# Patient Record
Sex: Male | Born: 2000 | Race: Black or African American | Hispanic: No | Smoking: Never smoker
Health system: Southern US, Community
[De-identification: ages and names within clinical notes are randomized; demographics above are authoritative.]

## PROBLEM LIST (undated history)

## (undated) DIAGNOSIS — R569 Unspecified convulsions: Secondary | ICD-10-CM

## (undated) DIAGNOSIS — F419 Anxiety disorder, unspecified: Secondary | ICD-10-CM

## (undated) DIAGNOSIS — F32A Depression, unspecified: Secondary | ICD-10-CM

## (undated) HISTORY — DX: Depression, unspecified: F32.A

## (undated) HISTORY — DX: Anxiety disorder, unspecified: F41.9

---

## 2020-06-19 ENCOUNTER — Other Ambulatory Visit: Payer: Self-pay

## 2020-06-19 ENCOUNTER — Emergency Department (HOSPITAL_COMMUNITY): Payer: Medicaid Other

## 2020-06-19 ENCOUNTER — Emergency Department (HOSPITAL_COMMUNITY)
Admission: EM | Admit: 2020-06-19 | Discharge: 2020-06-20 | Disposition: A | Discharge status: Home / Self Care | Payer: Medicaid Other | Attending: Emergency Medicine | Admitting: Emergency Medicine

## 2020-06-19 ENCOUNTER — Encounter (HOSPITAL_COMMUNITY): Payer: Self-pay

## 2020-06-19 DIAGNOSIS — R072 Precordial pain: Secondary | ICD-10-CM | POA: Diagnosis present

## 2020-06-19 DIAGNOSIS — U071 COVID-19: Secondary | ICD-10-CM | POA: Insufficient documentation

## 2020-06-19 DIAGNOSIS — R9431 Abnormal electrocardiogram [ECG] [EKG]: Secondary | ICD-10-CM

## 2020-06-19 HISTORY — DX: Unspecified convulsions: R56.9

## 2020-06-19 LAB — CBC
HCT: 48.9 % (ref 39.0–52.0)
Hemoglobin: 16.1 g/dL (ref 13.0–17.0)
MCH: 27.8 pg (ref 26.0–34.0)
MCHC: 32.9 g/dL (ref 30.0–36.0)
MCV: 84.3 fL (ref 80.0–100.0)
Platelets: 255 10*3/uL (ref 150–400)
RBC: 5.8 MIL/uL (ref 4.22–5.81)
RDW: 12.5 % (ref 11.5–15.5)
WBC: 8.7 10*3/uL (ref 4.0–10.5)
nRBC: 0 % (ref 0.0–0.2)

## 2020-06-19 LAB — COMPREHENSIVE METABOLIC PANEL
ALT: 31 U/L (ref 0–44)
AST: 20 U/L (ref 15–41)
Albumin: 4 g/dL (ref 3.5–5.0)
Alkaline Phosphatase: 92 U/L (ref 38–126)
Anion gap: 11 (ref 5–15)
BUN: 6 mg/dL (ref 6–20)
CO2: 22 mmol/L (ref 22–32)
Calcium: 9.3 mg/dL (ref 8.9–10.3)
Chloride: 107 mmol/L (ref 98–111)
Creatinine, Ser: 1.01 mg/dL (ref 0.61–1.24)
GFR, Estimated: >60 mL/min (ref >60)
Glucose, Bld: 101 mg/dL — ABNORMAL HIGH (ref 70–99)
Potassium: 3.9 mmol/L (ref 3.5–5.1)
Sodium: 140 mmol/L (ref 135–145)
Total Bilirubin: 0.5 mg/dL (ref 0.3–1.2)
Total Protein: 7.3 g/dL (ref 6.5–8.1)

## 2020-06-19 LAB — DIFFERENTIAL
Abs Immature Granulocytes: 0.03 10*3/uL (ref 0.00–0.07)
Basophils Absolute: 0 10*3/uL (ref 0.0–0.1)
Basophils Relative: 0 %
Eosinophils Absolute: 0.5 10*3/uL (ref 0.0–0.5)
Eosinophils Relative: 5 %
Immature Granulocytes: 0 %
Lymphocytes Relative: 30 %
Lymphs Abs: 2.6 10*3/uL (ref 0.7–4.0)
Monocytes Absolute: 1 10*3/uL (ref 0.1–1.0)
Monocytes Relative: 12 %
Neutro Abs: 4.6 10*3/uL (ref 1.7–7.7)
Neutrophils Relative %: 53 %

## 2020-06-19 MED ORDER — SODIUM CHLORIDE 0.9% FLUSH
3.0000 mL | Freq: Once | INTRAVENOUS | Status: AC
Start: 2020-06-19 — End: 2020-06-20
  Administered 2020-06-20: 3 mL via INTRAVENOUS

## 2020-06-19 NOTE — ED Triage Notes (Signed)
Pt reports that he has been having CP all day and a headache for the past 4 days, pt reports that he is having L sided weakness that has been going on all day, reports that L sided weakness is normal for the past year but worse today since waking up. Pt has decreased L sided hand grip, no drift, L leg weakness

## 2020-06-20 ENCOUNTER — Emergency Department (HOSPITAL_COMMUNITY): Payer: Medicaid Other

## 2020-06-20 ENCOUNTER — Encounter (HOSPITAL_COMMUNITY): Payer: Self-pay

## 2020-06-20 LAB — TROPONIN I (HIGH SENSITIVITY)
Troponin I (High Sensitivity): 5 ng/L (ref <18)
Troponin I (High Sensitivity): 4 ng/L (ref <18)

## 2020-06-20 LAB — RESP PANEL BY RT-PCR (FLU A&B, COVID) ARPGX2
Influenza A by PCR: NEGATIVE
Influenza B by PCR: NEGATIVE
SARS Coronavirus 2 by RT PCR: POSITIVE — AB

## 2020-06-20 LAB — CBG MONITORING, ED: Glucose-Capillary: 87 mg/dL (ref 70–99)

## 2020-06-20 MED ORDER — IOHEXOL 350 MG/ML SOLN
100.0000 mL | Freq: Once | INTRAVENOUS | Status: AC | PRN
  Administered 2020-06-20: 100 mL via INTRAVENOUS

## 2020-06-20 MED ORDER — ASPIRIN 81 MG PO CHEW
324.0000 mg | CHEWABLE_TABLET | Freq: Once | ORAL | Status: AC
  Administered 2020-06-20: 324 mg via ORAL
  Filled 2020-06-20: qty 4

## 2020-06-20 MED ORDER — KETOROLAC TROMETHAMINE 30 MG/ML IJ SOLN
30.0000 mg | Freq: Once | INTRAMUSCULAR | Status: AC
  Administered 2020-06-20: 30 mg via INTRAVENOUS
  Filled 2020-06-20: qty 1

## 2020-06-20 MED ORDER — ALUM & MAG HYDROXIDE-SIMETH 200-200-20 MG/5ML PO SUSP
30.0000 mL | Freq: Once | ORAL | Status: AC
  Administered 2020-06-20: 30 mL via ORAL
  Filled 2020-06-20: qty 30

## 2020-06-20 NOTE — ED Provider Notes (Signed)
[name] EMERGENCY DEPARTMENT Provider Note   CSN: [id number] Arrival date & time: 06/19/20  2225     History Chief Complaint  Patient presents with  . Chest Pain    Craig Carlson is a 20 y.o. male.  The history is provided by the patient.  Chest Pain Pain location:  Substernal area Pain quality: throbbing   Pain radiates to:  Does not radiate Pain severity:  Moderate Onset quality:  Gradual Timing:  Constant Progression:  Unchanged Chronicity:  New Context: at rest   Relieved by:  Nothing Worsened by:  Nothing Ineffective treatments:  None tried Associated symptoms: cough and nausea   Associated symptoms: no dizziness, no fever and no shortness of breath   Risk factors: male sex   Risk factors: no aortic disease   Also has chtronic migraines worse in the last week.  He also has chronic hand weakness for which he is followed at [facility]. No leg pain or swelling.      Past Medical History:  Diagnosis Date  . Seizures (HCC)     There are no problems to display for this patient.   History reviewed. No pertinent surgical history.     History reviewed. No pertinent family history.  Social History   Tobacco Use  . Smoking status: Never Smoker  . Smokeless tobacco: Never Used  Substance Use Topics  . Alcohol use: Not Currently  . Drug use: Not Currently    Home Medications Prior to Admission medications   Not on File    Allergies    Patient has no known allergies.  Review of Systems   Review of Systems  Constitutional: Negative for fever.  HENT: Negative for congestion.   Eyes: Negative for visual disturbance.  Respiratory: Positive for cough. Negative for shortness of breath.   Cardiovascular: Positive for chest pain.  Gastrointestinal: Positive for nausea.  Genitourinary: Negative for difficulty urinating.  Musculoskeletal: Negative for arthralgias.  Skin: Negative for rash.  Neurological: Negative for dizziness.   Psychiatric/Behavioral: Negative for agitation.  All other systems reviewed and are negative.   Physical Exam Updated Vital Signs BP (!) 107/50   Pulse 64   Temp 98.7 F (37.1 C) (Oral)   Resp 15   SpO2 98%   Physical Exam Vitals and nursing note reviewed.  Constitutional:      Appearance: Normal appearance. He is not ill-appearing.  HENT:     Head: Normocephalic and atraumatic.     Nose: Nose normal.  Eyes:     Conjunctiva/sclera: Conjunctivae normal.     Pupils: Pupils are equal, round, and reactive to light.  Cardiovascular:     Rate and Rhythm: Normal rate and regular rhythm.     Pulses: Normal pulses.     Heart sounds: Normal heart sounds.  Pulmonary:     Effort: Pulmonary effort is normal.     Breath sounds: Normal breath sounds.  Abdominal:     General: Abdomen is flat. Bowel sounds are normal.     Palpations: Abdomen is soft.     Tenderness: There is no abdominal tenderness. There is no guarding.  Musculoskeletal:        General: Normal range of motion.     Cervical back: Normal range of motion and neck supple.  Skin:    General: Skin is warm and dry.     Capillary Refill: Capillary refill takes less than 2 seconds.  Neurological:     General: No focal deficit present.  Mental Status: He is alert and oriented to person, place, and time.     Deep Tendon Reflexes: Reflexes normal.  Psychiatric:        Mood and Affect: Mood normal.        Behavior: Behavior normal.     ED Results / Procedures / Treatments   Labs (all labs ordered are listed, but only abnormal results are displayed) Results for orders placed or performed during the hospital encounter of 06/19/20  Resp Panel by RT-PCR (Flu A&B, Covid) Nasopharyngeal Swab   Specimen: Nasopharyngeal Swab; Nasopharyngeal(NP) swabs in vial transport medium  Result Value Ref Range   SARS Coronavirus 2 by RT PCR POSITIVE (A) NEGATIVE   Influenza A by PCR NEGATIVE NEGATIVE   Influenza B by PCR NEGATIVE  NEGATIVE  CBC  Result Value Ref Range   WBC 8.7 4.0 - 10.5 K/uL   RBC 5.80 4.22 - 5.81 MIL/uL   Hemoglobin 16.1 13.0 - 17.0 g/dL   HCT 42.8 76.8 - 11.5 %   MCV 84.3 80.0 - 100.0 fL   MCH 27.8 26.0 - 34.0 pg   MCHC 32.9 30.0 - 36.0 g/dL   RDW 72.6 20.3 - 55.9 %   Platelets 255 150 - 400 K/uL   nRBC 0.0 0.0 - 0.2 %  Differential  Result Value Ref Range   Neutrophils Relative % 53 %   Neutro Abs 4.6 1.7 - 7.7 K/uL   Lymphocytes Relative 30 %   Lymphs Abs 2.6 0.7 - 4.0 K/uL   Monocytes Relative 12 %   Monocytes Absolute 1.0 0.1 - 1.0 K/uL   Eosinophils Relative 5 %   Eosinophils Absolute 0.5 0.0 - 0.5 K/uL   Basophils Relative 0 %   Basophils Absolute 0.0 0.0 - 0.1 K/uL   Immature Granulocytes 0 %   Abs Immature Granulocytes 0.03 0.00 - 0.07 K/uL  Comprehensive metabolic panel  Result Value Ref Range   Sodium 140 135 - 145 mmol/L   Potassium 3.9 3.5 - 5.1 mmol/L   Chloride 107 98 - 111 mmol/L   CO2 22 22 - 32 mmol/L   Glucose, Bld 101 (H) 70 - 99 mg/dL   BUN 6 6 - 20 mg/dL   Creatinine, Ser 7.41 0.61 - 1.24 mg/dL   Calcium 9.3 8.9 - 63.8 mg/dL   Total Protein 7.3 6.5 - 8.1 g/dL   Albumin 4.0 3.5 - 5.0 g/dL   AST 20 15 - 41 U/L   ALT 31 0 - 44 U/L   Alkaline Phosphatase 92 38 - 126 U/L   Total Bilirubin 0.5 0.3 - 1.2 mg/dL   GFR, Estimated >45 >36 mL/min   Anion gap 11 5 - 15  CBG monitoring, ED  Result Value Ref Range   Glucose-Capillary 87 70 - 99 mg/dL  Troponin I (High Sensitivity)  Result Value Ref Range   Troponin I (High Sensitivity) 5 <18 ng/L  Troponin I (High Sensitivity)  Result Value Ref Range   Troponin I (High Sensitivity) 4 <18 ng/L   CT HEAD WO CONTRAST  Result Date: 06/19/2020 CLINICAL DATA:  Four days of headache with left-sided weakness for the past year, worsening today EXAM: CT HEAD WITHOUT CONTRAST TECHNIQUE: Contiguous axial images were obtained from the base of the skull through the vertex without intravenous contrast. COMPARISON:  CT  04/11/2020 FINDINGS: Brain: No evidence of acute infarction, hemorrhage, hydrocephalus, extra-axial collection, visible mass lesion or mass effect. Midline intracranial structures are unremarkable. Cerebellar tonsils are normally positioned. Scattered  benign dural calcifications. Vascular: No hyperdense vessel or unexpected calcification. Skull: No calvarial fracture or suspicious osseous lesion. No scalp swelling or hematoma. Sinuses/Orbits: Mild mural thickening in the ethmoids. Remaining paranasal sinuses and mastoid air cells are predominantly clear with pneumatization of the petrous apices and squamosal portions of the temporal bones. Other: None. IMPRESSION: No acute intracranial abnormality. Electronically Signed   By: Kreg ShropshirePrice  DeHay M.D.   On: 06/19/2020 23:19   CT Angio Chest/Abd/Pel for Dissection W and/or Wo Contrast  Result Date: 06/20/2020 CLINICAL DATA:  Abdominal pain. Aortic dissection suspected. Left-sided weakness. EXAM: CT ANGIOGRAPHY CHEST, ABDOMEN AND PELVIS TECHNIQUE: Non-contrast CT of the chest was initially obtained. Multidetector CT imaging through the chest, abdomen and pelvis was performed using the standard protocol during bolus administration of intravenous contrast. Multiplanar reconstructed images and MIPs were obtained and reviewed to evaluate the vascular anatomy. CONTRAST:  100mL OMNIPAQUE IOHEXOL 350 MG/ML SOLN COMPARISON:  None. FINDINGS: CTA CHEST FINDINGS Cardiovascular: There is no evidence for a thoracic aortic dissection or aneurysm. There is an aberrant right subclavian artery, a normal variant. There is no large centrally located pulmonary embolism. The heart size is normal. There is no significant pericardial effusion. Mediastinum/Nodes: -- No mediastinal lymphadenopathy. -- No hilar lymphadenopathy. -- No axillary lymphadenopathy. -- No supraclavicular lymphadenopathy. -- Normal thyroid gland where visualized. -  Unremarkable esophagus. Lungs/Pleura: Airways are  patent. No pleural effusion, lobar consolidation, pneumothorax or pulmonary infarction. Musculoskeletal: No chest wall abnormality. No bony spinal canal stenosis. Review of the MIP images confirms the above findings. CTA ABDOMEN AND PELVIS FINDINGS VASCULAR Aorta: Normal caliber aorta without aneurysm, dissection, vasculitis or significant stenosis. Celiac: Patent without evidence of aneurysm, dissection, vasculitis or significant stenosis. SMA: Patent without evidence of aneurysm, dissection, vasculitis or significant stenosis. Renals: Both renal arteries are patent without evidence of aneurysm, dissection, vasculitis, fibromuscular dysplasia or significant stenosis. IMA: Patent without evidence of aneurysm, dissection, vasculitis or significant stenosis. Inflow: Patent without evidence of aneurysm, dissection, vasculitis or significant stenosis. Veins: No obvious venous abnormality within the limitations of this arterial phase study. Review of the MIP images confirms the above findings. NON-VASCULAR Hepatobiliary: The liver is normal. Normal gallbladder.There is no biliary ductal dilation. Pancreas: Normal contours without ductal dilatation. No peripancreatic fluid collection. Spleen: Unremarkable. Adrenals/Urinary Tract: --Adrenal glands: Unremarkable. --Right kidney/ureter: No hydronephrosis or radiopaque kidney stones. --Left kidney/ureter: No hydronephrosis or radiopaque kidney stones. --Urinary bladder: Unremarkable. Stomach/Bowel: --Stomach/Duodenum: No hiatal hernia or other gastric abnormality. Normal duodenal course and caliber. --Small bowel: Unremarkable. --Colon: Unremarkable. --Appendix: Normal. Vascular/Lymphatic: Normal course and caliber of the major abdominal vessels. --No retroperitoneal lymphadenopathy. --No mesenteric lymphadenopathy. --No pelvic or inguinal lymphadenopathy. Reproductive: Unremarkable Other: No ascites or free air. The abdominal wall is normal. Musculoskeletal. No acute  displaced fractures. Review of the MIP images confirms the above findings. IMPRESSION: 1. No acute thoracic, abdominal or pelvic pathology. Specifically, there is no evidence for a thoracic aortic dissection or aneurysm. 2. Incidentally noted aberrant right subclavian artery, a normal variant. Electronically Signed   By: Katherine Mantlehristopher  Green M.D.   On: 06/20/2020 01:32    EKG See MUSE Radiology CT HEAD WO CONTRAST  Result Date: 06/19/2020 CLINICAL DATA:  Four days of headache with left-sided weakness for the past year, worsening today EXAM: CT HEAD WITHOUT CONTRAST TECHNIQUE: Contiguous axial images were obtained from the base of the skull through the vertex without intravenous contrast. COMPARISON:  CT 04/11/2020 FINDINGS: Brain: No evidence of acute infarction, hemorrhage, hydrocephalus, extra-axial collection, visible  mass lesion or mass effect. Midline intracranial structures are unremarkable. Cerebellar tonsils are normally positioned. Scattered benign dural calcifications. Vascular: No hyperdense vessel or unexpected calcification. Skull: No calvarial fracture or suspicious osseous lesion. No scalp swelling or hematoma. Sinuses/Orbits: Mild mural thickening in the ethmoids. Remaining paranasal sinuses and mastoid air cells are predominantly clear with pneumatization of the petrous apices and squamosal portions of the temporal bones. Other: None. IMPRESSION: No acute intracranial abnormality. Electronically Signed   By: Kreg ShropshirePrice  DeHay M.D.   On: 06/19/2020 23:19   CT Angio Chest/Abd/Pel for Dissection W and/or Wo Contrast  Result Date: 06/20/2020 CLINICAL DATA:  Abdominal pain. Aortic dissection suspected. Left-sided weakness. EXAM: CT ANGIOGRAPHY CHEST, ABDOMEN AND PELVIS TECHNIQUE: Non-contrast CT of the chest was initially obtained. Multidetector CT imaging through the chest, abdomen and pelvis was performed using the standard protocol during bolus administration of intravenous contrast. Multiplanar  reconstructed images and MIPs were obtained and reviewed to evaluate the vascular anatomy. CONTRAST:  100mL OMNIPAQUE IOHEXOL 350 MG/ML SOLN COMPARISON:  None. FINDINGS: CTA CHEST FINDINGS Cardiovascular: There is no evidence for a thoracic aortic dissection or aneurysm. There is an aberrant right subclavian artery, a normal variant. There is no large centrally located pulmonary embolism. The heart size is normal. There is no significant pericardial effusion. Mediastinum/Nodes: -- No mediastinal lymphadenopathy. -- No hilar lymphadenopathy. -- No axillary lymphadenopathy. -- No supraclavicular lymphadenopathy. -- Normal thyroid gland where visualized. -  Unremarkable esophagus. Lungs/Pleura: Airways are patent. No pleural effusion, lobar consolidation, pneumothorax or pulmonary infarction. Musculoskeletal: No chest wall abnormality. No bony spinal canal stenosis. Review of the MIP images confirms the above findings. CTA ABDOMEN AND PELVIS FINDINGS VASCULAR Aorta: Normal caliber aorta without aneurysm, dissection, vasculitis or significant stenosis. Celiac: Patent without evidence of aneurysm, dissection, vasculitis or significant stenosis. SMA: Patent without evidence of aneurysm, dissection, vasculitis or significant stenosis. Renals: Both renal arteries are patent without evidence of aneurysm, dissection, vasculitis, fibromuscular dysplasia or significant stenosis. IMA: Patent without evidence of aneurysm, dissection, vasculitis or significant stenosis. Inflow: Patent without evidence of aneurysm, dissection, vasculitis or significant stenosis. Veins: No obvious venous abnormality within the limitations of this arterial phase study. Review of the MIP images confirms the above findings. NON-VASCULAR Hepatobiliary: The liver is normal. Normal gallbladder.There is no biliary ductal dilation. Pancreas: Normal contours without ductal dilatation. No peripancreatic fluid collection. Spleen: Unremarkable. Adrenals/Urinary  Tract: --Adrenal glands: Unremarkable. --Right kidney/ureter: No hydronephrosis or radiopaque kidney stones. --Left kidney/ureter: No hydronephrosis or radiopaque kidney stones. --Urinary bladder: Unremarkable. Stomach/Bowel: --Stomach/Duodenum: No hiatal hernia or other gastric abnormality. Normal duodenal course and caliber. --Small bowel: Unremarkable. --Colon: Unremarkable. --Appendix: Normal. Vascular/Lymphatic: Normal course and caliber of the major abdominal vessels. --No retroperitoneal lymphadenopathy. --No mesenteric lymphadenopathy. --No pelvic or inguinal lymphadenopathy. Reproductive: Unremarkable Other: No ascites or free air. The abdominal wall is normal. Musculoskeletal. No acute displaced fractures. Review of the MIP images confirms the above findings. IMPRESSION: 1. No acute thoracic, abdominal or pelvic pathology. Specifically, there is no evidence for a thoracic aortic dissection or aneurysm. 2. Incidentally noted aberrant right subclavian artery, a normal variant. Electronically Signed   By: Katherine Mantlehristopher  Green M.D.   On: 06/20/2020 01:32    Procedures Procedures (including critical care time)  Medications Ordered in ED Medications  sodium chloride flush (NS) 0.9 % injection 3 mL (3 mLs Intravenous Given 06/20/20 0220)  iohexol (OMNIPAQUE) 350 MG/ML injection 100 mL (100 mLs Intravenous Contrast Given 06/20/20 0117)  aspirin chewable tablet 324 mg (324 mg  Oral Given 06/20/20 0153)  ketorolac (TORADOL) 30 MG/ML injection 30 mg (30 mg Intravenous Given 06/20/20 0219)  alum & mag hydroxide-simeth (MAALOX/MYLANTA) 200-200-20 MG/5ML suspension 30 mL (30 mLs Oral Given 06/20/20 0219)    ED Course  I have reviewed the triage vital signs and the nursing notes.  Pertinent labs & imaging results that were available during my care of the patient were reviewed by me and considered in my medical decision making (see chart for details).   3am Case d/w Dr. Mackie Pai of cardiology,  Not a stemi, cycle  troponins and follow up as an outpatient.   Patient ruled out for MI and dissection,  Symptoms likely due to covid.  Well appearing stable for discharge with close cardiology followup.   Craig Carlson Code was evaluated in Emergency Department on 06/20/2020 for the symptoms described in the history of present illness. He was evaluated in the context of the global COVID-19 pandemic, which necessitated consideration that the patient might be at risk for infection with the SARS-CoV-2 virus that causes COVID-19. Institutional protocols and algorithms that pertain to the evaluation of patients at risk for COVID-19 are in a state of rapid change based on information released by regulatory bodies including the CDC and federal and state organizations. These policies and algorithms were followed during the patient's care in the ED.    Final Clinical Impression(s) / ED Diagnoses  Return for intractable cough, coughing up blood,fevers >100.4 unrelieved by medication, shortness of breath, intractable vomiting, chest pain, shortness of breath, weakness,numbness, changes in speech, facial asymmetry,abdominal pain, passing out,Inability to tolerate liquids or food, cough, altered mental status or any concerns. No signs of systemic illness or infection. The patient is nontoxic-appearing on exam and vital signs are within normal limits.   I have reviewed the triage vital signs and the nursing notes. Pertinent labs &imaging results that were available during my care of the patient were reviewed by me and considered in my medical decision making (see chart for details).After history, exam, and medical workup I feel the patient has beenappropriately medically screened and is safe for discharge home. Pertinent diagnoses were discussed with the patient. Patient was given return precautions.   Kylii Ennis, MD 06/20/20 [phone]

## 2020-06-20 NOTE — ED Notes (Signed)
Pt transported to CT at this time.

## 2020-06-20 NOTE — ED Notes (Signed)
Date and time results received: 06/20/20 0255 (use smartphrase ".now" to insert current time)  Test: Covid Critical Value: Positive  Name of Provider Notified: Palumbo  Orders Received? Or Actions Taken?: No new orders received.

## 2020-10-27 ENCOUNTER — Telehealth: Payer: Self-pay

## 2020-10-27 NOTE — Telephone Encounter (Signed)
Patient has appt with Zelda on 6/1 but provider is working virtual that day. Called Patient and left vm that appt is virtual and he does not need to come to the office. If in person is preferred to call [phone] to reschedule in person.

## 2020-11-01 ENCOUNTER — Ambulatory Visit: Payer: Medicaid Other | Attending: Nurse Practitioner | Admitting: Nurse Practitioner

## 2020-11-01 ENCOUNTER — Telehealth: Payer: Self-pay | Admitting: Nurse Practitioner

## 2020-11-01 ENCOUNTER — Other Ambulatory Visit: Payer: Self-pay

## 2020-11-01 NOTE — Telephone Encounter (Signed)
No answer. LVM to return call to office for televisit today.  

## 2020-11-24 ENCOUNTER — Emergency Department (HOSPITAL_COMMUNITY)
Admission: EM | Admit: 2020-11-24 | Discharge: 2020-11-25 | Disposition: A | Discharge status: Home / Self Care | Payer: Medicaid Other | Attending: Emergency Medicine | Admitting: Emergency Medicine

## 2020-11-24 DIAGNOSIS — H53149 Visual discomfort, unspecified: Secondary | ICD-10-CM | POA: Diagnosis not present

## 2020-11-24 DIAGNOSIS — R569 Unspecified convulsions: Secondary | ICD-10-CM | POA: Diagnosis not present

## 2020-11-24 DIAGNOSIS — R519 Headache, unspecified: Secondary | ICD-10-CM

## 2020-11-24 LAB — BASIC METABOLIC PANEL
Anion gap: 7 (ref 5–15)
BUN: 9 mg/dL (ref 6–20)
CO2: 26 mmol/L (ref 22–32)
Calcium: 9.2 mg/dL (ref 8.9–10.3)
Chloride: 106 mmol/L (ref 98–111)
Creatinine, Ser: 1.11 mg/dL (ref 0.61–1.24)
GFR, Estimated: >60 mL/min (ref >60)
Glucose, Bld: 93 mg/dL (ref 70–99)
Potassium: 3.9 mmol/L (ref 3.5–5.1)
Sodium: 139 mmol/L (ref 135–145)

## 2020-11-24 LAB — CBC WITH DIFFERENTIAL/PLATELET
Abs Immature Granulocytes: 0.02 10*3/uL (ref 0.00–0.07)
Basophils Absolute: 0 10*3/uL (ref 0.0–0.1)
Basophils Relative: 0 %
Eosinophils Absolute: 0.4 10*3/uL (ref 0.0–0.5)
Eosinophils Relative: 4 %
HCT: 47.1 % (ref 39.0–52.0)
Hemoglobin: 15.4 g/dL (ref 13.0–17.0)
Immature Granulocytes: 0 %
Lymphocytes Relative: 38 %
Lymphs Abs: 3.5 10*3/uL (ref 0.7–4.0)
MCH: 27.8 pg (ref 26.0–34.0)
MCHC: 32.7 g/dL (ref 30.0–36.0)
MCV: 85 fL (ref 80.0–100.0)
Monocytes Absolute: 0.8 10*3/uL (ref 0.1–1.0)
Monocytes Relative: 9 %
Neutro Abs: 4.6 10*3/uL (ref 1.7–7.7)
Neutrophils Relative %: 49 %
Platelets: 274 10*3/uL (ref 150–400)
RBC: 5.54 MIL/uL (ref 4.22–5.81)
RDW: 12.4 % (ref 11.5–15.5)
WBC: 9.2 10*3/uL (ref 4.0–10.5)
nRBC: 0 % (ref 0.0–0.2)

## 2020-11-24 LAB — CBG MONITORING, ED: Glucose-Capillary: 92 mg/dL (ref 70–99)

## 2020-11-24 MED ORDER — METOCLOPRAMIDE HCL 5 MG/ML IJ SOLN
10.0000 mg | Freq: Once | INTRAMUSCULAR | Status: AC
  Administered 2020-11-24: 10 mg via INTRAVENOUS
  Filled 2020-11-24: qty 2

## 2020-11-24 MED ORDER — DIPHENHYDRAMINE HCL 50 MG/ML IJ SOLN
50.0000 mg | Freq: Once | INTRAMUSCULAR | Status: AC
  Administered 2020-11-24: 50 mg via INTRAVENOUS
  Filled 2020-11-24: qty 1
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
## 2020-11-25 MED ORDER — ACETAMINOPHEN 500 MG PO TABS
1000.0000 mg | ORAL_TABLET | Freq: Once | ORAL | Status: AC
  Administered 2020-11-25: 1000 mg via ORAL
  Filled 2020-11-25: qty 2

## 2020-11-25 NOTE — Discharge Instructions (Signed)
You came to the emergency department today to be evaluated for your seizure-like activity.  Your physical exam, lab work and EKG were reassuring.  Please follow-up with your neurologist at [facility].  If you are unable to follow-up with his neurologist please use information on this page to contact a local neurologist.  Do not drive or operate heavy machinery until you can be reevaluated by neurologist in the outpatient setting  Get help right away if: You injure yourself during a non-epileptic seizure. You have one non-epileptic seizure after another. You have trouble recovering from a non-epileptic seizure. You have chest pain or trouble breathing. You have a non-epileptic seizure that lasts longer than 5 minutes.

## 2020-11-25 NOTE — ED Notes (Signed)
Pt given sandwich bag, cheese, and crackers

## 2020-11-25 NOTE — ED Notes (Signed)
E-signature pad unavailable at time of pt discharge. This RN discussed discharge materials with pt and answered all pt questions. Pt stated understanding of discharge material. ? ?

## 2020-12-08 ENCOUNTER — Emergency Department (HOSPITAL_BASED_OUTPATIENT_CLINIC_OR_DEPARTMENT_OTHER): Payer: Medicaid Other | Admitting: Radiology

## 2020-12-08 ENCOUNTER — Emergency Department (HOSPITAL_BASED_OUTPATIENT_CLINIC_OR_DEPARTMENT_OTHER)
Admission: EM | Admit: 2020-12-08 | Discharge: 2020-12-08 | Disposition: A | Discharge status: Home / Self Care | Payer: Medicaid Other | Attending: Emergency Medicine | Admitting: Emergency Medicine

## 2020-12-08 ENCOUNTER — Encounter (HOSPITAL_BASED_OUTPATIENT_CLINIC_OR_DEPARTMENT_OTHER): Payer: Self-pay

## 2020-12-08 ENCOUNTER — Other Ambulatory Visit: Payer: Self-pay

## 2020-12-08 DIAGNOSIS — R1030 Lower abdominal pain, unspecified: Secondary | ICD-10-CM | POA: Insufficient documentation

## 2020-12-08 DIAGNOSIS — R0789 Other chest pain: Secondary | ICD-10-CM | POA: Diagnosis not present

## 2020-12-08 DIAGNOSIS — R300 Dysuria: Secondary | ICD-10-CM | POA: Diagnosis not present

## 2020-12-08 LAB — URINALYSIS, ROUTINE W REFLEX MICROSCOPIC
Bilirubin Urine: NEGATIVE
Glucose, UA: NEGATIVE mg/dL
Hgb urine dipstick: NEGATIVE
Ketones, ur: NEGATIVE mg/dL
Leukocytes,Ua: NEGATIVE
Nitrite: NEGATIVE
Specific Gravity, Urine: 1.025 (ref 1.005–1.030)
pH: 6.5 (ref 5.0–8.0)

## 2020-12-08 LAB — BASIC METABOLIC PANEL
Anion gap: 7 (ref 5–15)
BUN: 9 mg/dL (ref 6–20)
CO2: 27 mmol/L (ref 22–32)
Calcium: 8.9 mg/dL (ref 8.9–10.3)
Chloride: 105 mmol/L (ref 98–111)
Creatinine, Ser: 0.98 mg/dL (ref 0.61–1.24)
GFR, Estimated: >60 mL/min (ref >60)
Glucose, Bld: 96 mg/dL (ref 70–99)
Potassium: 3.8 mmol/L (ref 3.5–5.1)
Sodium: 139 mmol/L (ref 135–145)

## 2020-12-08 LAB — TROPONIN I (HIGH SENSITIVITY): Troponin I (High Sensitivity): 2 ng/L (ref <18)

## 2020-12-08 LAB — CBC WITH DIFFERENTIAL/PLATELET
Abs Immature Granulocytes: 0.01 10*3/uL (ref 0.00–0.07)
Basophils Absolute: 0 10*3/uL (ref 0.0–0.1)
Basophils Relative: 0 %
Eosinophils Absolute: 0.3 10*3/uL (ref 0.0–0.5)
Eosinophils Relative: 4 %
HCT: 47 % (ref 39.0–52.0)
Hemoglobin: 15.7 g/dL (ref 13.0–17.0)
Immature Granulocytes: 0 %
Lymphocytes Relative: 47 %
Lymphs Abs: 3.3 10*3/uL (ref 0.7–4.0)
MCH: 28 pg (ref 26.0–34.0)
MCHC: 33.4 g/dL (ref 30.0–36.0)
MCV: 83.9 fL (ref 80.0–100.0)
Monocytes Absolute: 0.8 10*3/uL (ref 0.1–1.0)
Monocytes Relative: 11 %
Neutro Abs: 2.7 10*3/uL (ref 1.7–7.7)
Neutrophils Relative %: 38 %
Platelets: 251 10*3/uL (ref 150–400)
RBC: 5.6 MIL/uL (ref 4.22–5.81)
RDW: 12.8 % (ref 11.5–15.5)
WBC: 7.1 10*3/uL (ref 4.0–10.5)
nRBC: 0 % (ref 0.0–0.2)

## 2020-12-08 MED ORDER — CEFTRIAXONE SODIUM 500 MG IJ SOLR
500.0000 mg | Freq: Once | INTRAMUSCULAR | Status: AC
  Administered 2020-12-08: 500 mg via INTRAMUSCULAR
  Filled 2020-12-08: qty 500

## 2020-12-08 MED ORDER — DOXYCYCLINE HYCLATE 100 MG PO TABS: 100.0000 mg | ORAL_TABLET | Freq: Two times a day (BID) | ORAL | 0 refills | Status: DC

## 2020-12-08 MED ORDER — LIDOCAINE HCL (PF) 1 % IJ SOLN
1.0000 mL | Freq: Once | INTRAMUSCULAR | Status: AC
  Administered 2020-12-08: 1 mL
  Filled 2020-12-08: qty 5

## 2020-12-08 NOTE — ED Triage Notes (Signed)
Patient here POV from Home with CP and ABD Pain.  Patient states he has been having Lower ABD Pain for 2 weeks that has been worsening. Patient also states he has been having Left Sided CP for approximately 1 month that is sharp, intermittent, and radiates to R. Arm.  Hx of Seizures (states he has been having them more frequently). Patient also states it is painful at times to urinate.   Ambulatory, GCS 15. NAD. VSS. No SOB, No N/V/D.

## 2020-12-08 NOTE — ED Notes (Signed)
Pt verbalizes understanding discharge instructions, reviewed with pt.

## 2020-12-08 NOTE — ED Provider Notes (Signed)
[facility] EMERGENCY DEPARTMENT Provider Note  CSN: [id number] Arrival date & time: 12/08/20 0654    History Chief Complaint  Patient presents with   Abdominal Pain   Chest Pain    Craig Carlson is a 20 y.o. male with no significant PMH presents for evaluation of two different complaints. First he has had R upper chest pain, radiating into his R arm for the last months, comes and goes, no particular provoking or relieving factors. Has been more constant since yesterday. Not associated with SOB, nausea or diaphoresis.   He also reports 2 weeks of lower abdominal pain, burning with urination. Not associated with penile discharge, fever, back pain, N/V/D. He is sexually active, does not wear condoms.    Past Medical History:  Diagnosis Date   Seizures (HCC)     History reviewed. No pertinent surgical history.  No family history on file.  Social History   Tobacco Use   Smoking status: Never   Smokeless tobacco: Never  Substance Use Topics   Alcohol use: Not Currently   Drug use: Not Currently     Home Medications Prior to Admission medications   Medication Sig Start Date End Date Taking? Authorizing Provider  doxycycline (VIBRA-TABS) 100 MG tablet Take 1 tablet (100 mg total) by mouth 2 (two) times daily. 12/08/20  Yes Pollyann Savoy, MD     Allergies    Patient has no known allergies.   Review of Systems   Review of Systems A comprehensive review of systems was completed and negative except as noted in HPI.    Physical Exam BP 121/64   Pulse 63   Temp 98.6 F (37 C) (Oral)   Resp 13   Ht 6\' 3"  (1.905 m)   Wt 127 kg   SpO2 100%   BMI 35.00 kg/m   Physical Exam Vitals and nursing note reviewed.  Constitutional:      Appearance: Normal appearance.  HENT:     Head: Normocephalic and atraumatic.     Nose: Nose normal.     Mouth/Throat:     Mouth: Mucous membranes are moist.  Eyes:     Extraocular Movements: Extraocular  movements intact.     Conjunctiva/sclera: Conjunctivae normal.  Cardiovascular:     Rate and Rhythm: Normal rate.  Pulmonary:     Effort: Pulmonary effort is normal.     Breath sounds: Normal breath sounds.  Chest:     Chest wall: No tenderness.  Abdominal:     General: Abdomen is flat.     Palpations: Abdomen is soft.     Tenderness: There is no abdominal tenderness.  Musculoskeletal:        General: No swelling. Normal range of motion.     Cervical back: Neck supple.  Skin:    General: Skin is warm and dry.  Neurological:     General: No focal deficit present.     Mental Status: He is alert.  Psychiatric:        Mood and Affect: Mood normal.     ED Results / Procedures / Treatments   Labs (all labs ordered are listed, but only abnormal results are displayed) Labs Reviewed  URINALYSIS, ROUTINE W REFLEX MICROSCOPIC - Abnormal; Notable for the following components:      Result Value   Protein, ur TRACE (*)    All other components within normal limits  BASIC METABOLIC PANEL  CBC WITH DIFFERENTIAL/PLATELET  GC/CHLAMYDIA PROBE AMP ([place]) NOT AT [facility]  TROPONIN I (HIGH SENSITIVITY)    EKG EKG Interpretation  Date/Time:  Friday December 08 2020 07:01:35 EDT Ventricular Rate:  72 PR Interval:  176 QRS Duration: 92 QT Interval:  372 QTC Calculation: 408 R Axis:   21 Text Interpretation: Duplicate Confirmed by Susy Frizzle [phone]) on 12/08/2020 7:09:23 AM   Radiology DG Chest 2 View  Result Date: 12/08/2020 CLINICAL DATA:  20 year old male with history of chest pain and lower abdominal pain. EXAM: CHEST - 2 VIEW COMPARISON:  Chest x-ray 10/16/2019. FINDINGS: Lung volumes are normal. No consolidative airspace disease. No pleural effusions. No pneumothorax. No pulmonary nodule or mass noted. Pulmonary vasculature and the cardiomediastinal silhouette are within normal limits. IMPRESSION: No radiographic evidence of acute cardiopulmonary disease. Electronically Signed    By: Trudie Reed M.D.   On: 12/08/2020 07:44    Procedures Procedures  Medications Ordered in the ED Medications  cefTRIAXone (ROCEPHIN) injection 500 mg (has no administration in time range)  lidocaine (PF) (XYLOCAINE) 1 % injection 1 mL (has no administration in time range)     MDM Rules/Calculators/A&P MDM Patient with two complaints, neither are acute. Will check labs including single Trop, UA and GC/CT.   ED Course  I have reviewed the triage vital signs and the nursing notes.  Pertinent labs & imaging results that were available during my care of the patient were reviewed by me and considered in my medical decision making (see chart for details).  Clinical Course as of 12/08/20 0832  Fri Dec 08, 2020  0736 CBC is normal.  [CS]  0740 UA is normal.  [CS]  0748 CXR is normal.  [CS]  0807 BMP is normal.  [CS]  0826 Trop is negative.  [CS]  [id number] Given patient's concern for STI, will treat empirically for GC/CT pending results. No other concerning findings with his ED workup today. Low risk for ACS with atypical symptoms. Abdomen is benign.  [CS]    Clinical Course User Index [CS] Pollyann Savoy, MD    Final Clinical Impression(s) / ED Diagnoses Final diagnoses:  Atypical chest pain  Dysuria    Rx / DC Orders ED Discharge Orders          Ordered    doxycycline (VIBRA-TABS) 100 MG tablet  2 times daily        12/08/20 0831             Pollyann Savoy, MD 12/08/20 [phone]

## 2020-12-10 LAB — GC/CHLAMYDIA PROBE AMP (~~LOC~~) NOT AT ARMC
Chlamydia: NEGATIVE
Comment: NEGATIVE
Comment: NORMAL
Neisseria Gonorrhea: NEGATIVE

## 2020-12-13 ENCOUNTER — Other Ambulatory Visit: Payer: Self-pay

## 2020-12-13 ENCOUNTER — Encounter (HOSPITAL_BASED_OUTPATIENT_CLINIC_OR_DEPARTMENT_OTHER): Payer: Self-pay | Admitting: Obstetrics and Gynecology

## 2020-12-13 ENCOUNTER — Emergency Department (HOSPITAL_BASED_OUTPATIENT_CLINIC_OR_DEPARTMENT_OTHER)
Admission: EM | Admit: 2020-12-13 | Discharge: 2020-12-14 | Disposition: A | Discharge status: Home / Self Care | Payer: Medicaid Other | Attending: Emergency Medicine | Admitting: Emergency Medicine

## 2020-12-13 DIAGNOSIS — G43909 Migraine, unspecified, not intractable, without status migrainosus: Secondary | ICD-10-CM | POA: Diagnosis not present

## 2020-12-13 DIAGNOSIS — K920 Hematemesis: Secondary | ICD-10-CM | POA: Diagnosis present

## 2020-12-13 DIAGNOSIS — M549 Dorsalgia, unspecified: Secondary | ICD-10-CM | POA: Diagnosis not present

## 2020-12-13 DIAGNOSIS — E860 Dehydration: Secondary | ICD-10-CM | POA: Diagnosis not present

## 2020-12-13 DIAGNOSIS — R112 Nausea with vomiting, unspecified: Secondary | ICD-10-CM | POA: Diagnosis not present

## 2020-12-13 DIAGNOSIS — H53149 Visual discomfort, unspecified: Secondary | ICD-10-CM | POA: Diagnosis not present

## 2020-12-13 NOTE — ED Triage Notes (Signed)
Patient reports tot he ER for emesis. Patient states he has had blood in his emesis. Patient reports he also has back pain and he is having a "major migraine"

## 2020-12-14 ENCOUNTER — Encounter (HOSPITAL_BASED_OUTPATIENT_CLINIC_OR_DEPARTMENT_OTHER): Payer: Self-pay

## 2020-12-14 ENCOUNTER — Emergency Department (HOSPITAL_BASED_OUTPATIENT_CLINIC_OR_DEPARTMENT_OTHER)
Admission: EM | Admit: 2020-12-14 | Discharge: 2020-12-14 | Disposition: A | Discharge status: Home / Self Care | Payer: Medicaid Other | Source: Home / Self Care | Attending: Emergency Medicine | Admitting: Emergency Medicine

## 2020-12-14 ENCOUNTER — Emergency Department (HOSPITAL_BASED_OUTPATIENT_CLINIC_OR_DEPARTMENT_OTHER): Payer: Medicaid Other

## 2020-12-14 DIAGNOSIS — R569 Unspecified convulsions: Secondary | ICD-10-CM

## 2020-12-14 DIAGNOSIS — E860 Dehydration: Secondary | ICD-10-CM

## 2020-12-14 DIAGNOSIS — R519 Headache, unspecified: Secondary | ICD-10-CM

## 2020-12-14 DIAGNOSIS — H53149 Visual discomfort, unspecified: Secondary | ICD-10-CM | POA: Insufficient documentation

## 2020-12-14 DIAGNOSIS — R112 Nausea with vomiting, unspecified: Secondary | ICD-10-CM | POA: Insufficient documentation

## 2020-12-14 LAB — URINALYSIS, ROUTINE W REFLEX MICROSCOPIC
Bilirubin Urine: NEGATIVE
Glucose, UA: NEGATIVE mg/dL
Hgb urine dipstick: NEGATIVE
Ketones, ur: NEGATIVE mg/dL
Leukocytes,Ua: NEGATIVE
Nitrite: NEGATIVE
Specific Gravity, Urine: 1.014 (ref 1.005–1.030)
pH: 6 (ref 5.0–8.0)

## 2020-12-14 LAB — CBC WITH DIFFERENTIAL/PLATELET
Abs Immature Granulocytes: 0.01 K/uL (ref 0.00–0.07)
Basophils Absolute: 0 K/uL (ref 0.0–0.1)
Basophils Relative: 0 %
Eosinophils Absolute: 0.2 K/uL (ref 0.0–0.5)
Eosinophils Relative: 3 %
HCT: 45.8 % (ref 39.0–52.0)
Hemoglobin: 15.3 g/dL (ref 13.0–17.0)
Immature Granulocytes: 0 %
Lymphocytes Relative: 46 %
Lymphs Abs: 2.7 K/uL (ref 0.7–4.0)
MCH: 28 pg (ref 26.0–34.0)
MCHC: 33.4 g/dL (ref 30.0–36.0)
MCV: 83.7 fL (ref 80.0–100.0)
Monocytes Absolute: 0.7 K/uL (ref 0.1–1.0)
Monocytes Relative: 11 %
Neutro Abs: 2.5 K/uL (ref 1.7–7.7)
Neutrophils Relative %: 40 %
Platelets: 242 K/uL (ref 150–400)
RBC: 5.47 MIL/uL (ref 4.22–5.81)
RDW: 12.6 % (ref 11.5–15.5)
WBC: 6.1 K/uL (ref 4.0–10.5)
nRBC: 0 % (ref 0.0–0.2)

## 2020-12-14 LAB — COMPREHENSIVE METABOLIC PANEL WITH GFR
ALT: 30 U/L (ref 0–44)
AST: 23 U/L (ref 15–41)
Albumin: 4.3 g/dL (ref 3.5–5.0)
Alkaline Phosphatase: 80 U/L (ref 38–126)
Anion gap: 8 (ref 5–15)
BUN: 9 mg/dL (ref 6–20)
CO2: 28 mmol/L (ref 22–32)
Calcium: 8.8 mg/dL — ABNORMAL LOW (ref 8.9–10.3)
Chloride: 104 mmol/L (ref 98–111)
Creatinine, Ser: 0.99 mg/dL (ref 0.61–1.24)
GFR, Estimated: >60 mL/min
Glucose, Bld: 94 mg/dL (ref 70–99)
Potassium: 3.9 mmol/L (ref 3.5–5.1)
Sodium: 140 mmol/L (ref 135–145)
Total Bilirubin: 0.7 mg/dL (ref 0.3–1.2)
Total Protein: 6.9 g/dL (ref 6.5–8.1)

## 2020-12-14 LAB — CBG MONITORING, ED: Glucose-Capillary: 104 mg/dL — ABNORMAL HIGH (ref 70–99)

## 2020-12-14 LAB — RAPID URINE DRUG SCREEN, HOSP PERFORMED
Amphetamines: NOT DETECTED
Barbiturates: NOT DETECTED
Benzodiazepines: NOT DETECTED
Cocaine: NOT DETECTED
Opiates: NOT DETECTED
Tetrahydrocannabinol: NOT DETECTED

## 2020-12-14 LAB — LIPASE, BLOOD: Lipase: 25 U/L (ref 11–51)

## 2020-12-14 MED ORDER — SODIUM CHLORIDE 0.9 % IV BOLUS
1000.0000 mL | Freq: Once | INTRAVENOUS | Status: AC
  Administered 2020-12-14: 1000 mL via INTRAVENOUS

## 2020-12-14 MED ORDER — PROCHLORPERAZINE EDISYLATE 10 MG/2ML IJ SOLN
10.0000 mg | Freq: Once | INTRAMUSCULAR | Status: AC
  Administered 2020-12-14: 10 mg via INTRAVENOUS
  Filled 2020-12-14: qty 2

## 2020-12-14 MED ORDER — DIPHENHYDRAMINE HCL 50 MG/ML IJ SOLN
50.0000 mg | Freq: Once | INTRAMUSCULAR | Status: AC
  Administered 2020-12-14: 50 mg via INTRAVENOUS
  Filled 2020-12-14: qty 1

## 2020-12-14 NOTE — ED Triage Notes (Addendum)
Pt presents with his fiance who states pt had 2 seizures back to back early this morning.  She reports the first seizure was "very short" and the second one lasted for approximately 15 minutes and he was not breathing.  Pt has a history of seizures but states he is not currently taking any seizure medication. Pt also states he has numbness in his lower back.

## 2020-12-14 NOTE — ED Notes (Signed)
Pt given urinal to provide a urine sample. 

## 2020-12-14 NOTE — ED Provider Notes (Signed)
[facility]   CSN: [id number] Arrival date & time: 12/14/20  0730     History No chief complaint on file.   Rocco SereneKenneth Alvin Winegarden is a 20 y.o. male.  The history is provided by the patient and medical records. No language interpreter was used.  Headache Pain location:  Generalized Quality:  Dull Pain severity now: greater than 10/10. Onset quality:  Gradual Duration: several days. Timing:  Constant Progression:  Waxing and waning Chronicity:  Recurrent Similar to prior headaches: yes   Context: bright light   Worsened by:  Light Ineffective treatments:  None tried Associated symptoms: nausea, photophobia, seizures, vomiting and weakness (at baseline reported)   Associated symptoms: no abdominal pain, no back pain, no blurred vision, no congestion, no cough, no diarrhea, no fatigue, no fever, no focal weakness, no near-syncope, no neck pain, no neck stiffness, no numbness, no paresthesias and no visual change       Past Medical History:  Diagnosis Date   Seizures (HCC)     There are no problems to display for this patient.   No past surgical history on file.     No family history on file.  Social History   Tobacco Use   Smoking status: Never   Smokeless tobacco: Never  Substance Use Topics   Alcohol use: Not Currently   Drug use: Not Currently    Home Medications Prior to Admission medications   Medication Sig Start Date End Date Taking? Authorizing Provider  doxycycline (VIBRA-TABS) 100 MG tablet Take 1 tablet (100 mg total) by mouth 2 (two) times daily. 12/08/20   Pollyann SavoySheldon, Charles B, MD    Allergies    Patient has no known allergies.  Review of Systems   Review of Systems  Constitutional:  Negative for chills, fatigue and fever.  HENT:  Negative for congestion.   Eyes:  Positive for photophobia. Negative for blurred vision.  Respiratory:  Negative for cough, chest tightness, shortness of breath and  wheezing.   Cardiovascular:  Negative for chest pain, palpitations, leg swelling and near-syncope.  Gastrointestinal:  Positive for nausea and vomiting. Negative for abdominal pain, constipation and diarrhea.  Genitourinary:  Negative for dysuria and flank pain.  Musculoskeletal:  Negative for back pain, neck pain and neck stiffness.  Skin:  Negative for rash and wound.  Neurological:  Positive for seizures, weakness (at baseline reported) and headaches. Negative for tremors, focal weakness, speech difficulty, light-headedness, numbness and paresthesias.  Psychiatric/Behavioral:  Negative for agitation and confusion.   All other systems reviewed and are negative.  Physical Exam Updated Vital Signs BP 121/69 (BP Location: Left Arm)   Pulse 70   Temp 98.3 F (36.8 C) (Oral)   Resp 19   SpO2 100%   Physical Exam Vitals and nursing note reviewed.  Constitutional:      General: He is not in acute distress.    Appearance: He is well-developed. He is not ill-appearing, toxic-appearing or diaphoretic.  HENT:     Head: Normocephalic and atraumatic.     Nose: No congestion or rhinorrhea.     Mouth/Throat:     Mouth: Mucous membranes are dry.     Pharynx: No oropharyngeal exudate or posterior oropharyngeal erythema.  Eyes:     Extraocular Movements: Extraocular movements intact.     Conjunctiva/sclera: Conjunctivae normal.     Pupils: Pupils are equal, round, and reactive to light.  Cardiovascular:     Rate and Rhythm: Normal rate and regular  rhythm.     Pulses: Normal pulses.     Heart sounds: No murmur heard. Pulmonary:     Effort: Pulmonary effort is normal. No respiratory distress.     Breath sounds: Normal breath sounds. No wheezing, rhonchi or rales.  Chest:     Chest wall: No tenderness.  Abdominal:     General: Abdomen is flat.     Palpations: Abdomen is soft.     Tenderness: There is no abdominal tenderness. There is no right CVA tenderness, left CVA tenderness, guarding  or rebound.  Musculoskeletal:        General: No tenderness or signs of injury.     Cervical back: Neck supple. No tenderness.     Right lower leg: No edema.     Left lower leg: No edema.  Skin:    General: Skin is warm and dry.     Findings: No erythema or rash.  Neurological:     General: No focal deficit present.     Mental Status: He is alert. Mental status is at baseline.     Sensory: Sensory deficit (bilateral low back numb at baseline for months. no distal numbness) present.     Motor: Weakness (L arm weakness at baseline by report) present.  Psychiatric:        Mood and Affect: Mood normal.    ED Results / Procedures / Treatments   Labs (all labs ordered are listed, but only abnormal results are displayed) Labs Reviewed  COMPREHENSIVE METABOLIC PANEL - Abnormal; Notable for the following components:      Result Value   Calcium 8.8 (*)    All other components within normal limits  URINALYSIS, ROUTINE W REFLEX MICROSCOPIC - Abnormal; Notable for the following components:   Protein, ur TRACE (*)    All other components within normal limits  CBG MONITORING, ED - Abnormal; Notable for the following components:   Glucose-Capillary 104 (*)    All other components within normal limits  URINE CULTURE  CBC WITH DIFFERENTIAL/PLATELET  LIPASE, BLOOD  RAPID URINE DRUG SCREEN, HOSP PERFORMED    EKG None  Radiology DG Chest Portable 1 View  Result Date: 12/14/2020 CLINICAL DATA:  20 year old male with seizure. Abnormal respirations. EXAM: PORTABLE CHEST 1 VIEW COMPARISON:  Chest radiograph 12/08/2020 and earlier. FINDINGS: Portable AP upright view at 0803 hours. Low lung volumes. Normal cardiac size and mediastinal contours. Visualized tracheal air column is within normal limits. Allowing for portable technique the lungs are clear. No pneumothorax or pleural effusion. No osseous abnormality identified. IMPRESSION: Lower lung volumes.  No cardiopulmonary abnormality.  Electronically Signed   By: Odessa Fleming M.D.   On: 12/14/2020 08:20    Procedures Procedures   Medications Ordered in ED Medications  sodium chloride 0.9 % bolus 1,000 mL (has no administration in time range)  prochlorperazine (COMPAZINE) injection 10 mg (has no administration in time range)  diphenhydrAMINE (BENADRYL) injection 50 mg (has no administration in time range)    ED Course  I have reviewed the triage vital signs and the nursing notes.  Pertinent labs & imaging results that were available during my care of the patient were reviewed by me and considered in my medical decision making (see chart for details).    MDM Rules/Calculators/A&P                          Hilberto Burzynski Canipe is a 20 y.o. 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  Patient says over the last few days he has been having some headaches similar to migraines with some photophobia.  He has had nausea and vomiting occasionally blood-tinged.  He reports he came yesterday but the wait was too long.  Patient decided to go home and then overnight had what family was concerned was 2 seizure-like episodes the last lasting approximately 10 minutes.  They did not report patient biting his tongue or losing control of bowel or bladders but he was shaking and had some abnormal breathing.  They present for evaluation.  Patient has had a history of seizures and nonepileptic seizures in the past per his chart however it does not appear patient has followed up again with neurology as directed previously.  Patient said he was going to call but due to these episodes today he wanted to be evaluated.  He frequently has nausea and vomiting with headaches and during previous visits they had attributed some of his nausea and vomiting to complicated migraines.  He reports he always has some weakness on his left side which is  unchanged.  He denies any fevers, chills, chest pain, shortness of breath, cough, dysuria, hematuria, or bowel symptoms.  Patient denies any trauma.  He does report that for months he has had some numbness across his low back bilaterally but denies significant pain there.  He denies any numbness or weakness in his legs however.  Denies any recent medication changes or other complaints on arrival.  On exam, patient moving all extremities.  Normal sensation throughout.  Patient has some degree strength in his left arm which she reports is chronic.  He has some subjective decrease sensation in his low back but otherwise has normal sensation in the legs.  Unclear etiology of this.  No rash seen to suggest shingles.  He reports it has been there for many months.  Lungs clear and chest nontender.  Abdomen nontender.  Pupil symmetric and reactive, extraocular movements.  Clear speech.  Symmetric smile.  No evidence of trauma seen.  Clinical aspect patient has a migraine leading to his headache, nausea, and vomiting.  However, with this abnormal breathing overnight, will check chest x-ray and get screening labs.  We will make sure there are no significant lecture abnormality contributing to his seizure-like episodes.  We will get urinalysis to make sure there is not significant evidence of UTI contributing to his symptoms.  We will give a headache cocktail and hydrate as he does appear somewhat dehydrated on exam.  If work-up is reassuring and he is feeling better, anticipate discharge with amatory referral to neurology to help facilitate close follow-up and evaluation of his recurrent seizure-like episodes and some of his chronic neurologic complaints.  Anticipate reassessment.  Work-up returned overall reassuring.  Patient feeling much better after headache cocktail and rehydration.  No further seizures in the over 4 and half hours he was observed here.  Patient would like to schedule appointment to follow-up  with neurology, will place amatory referral to neurology for seizure-like episodes.  Patient and family agree with plan of care and had no other questions or concerns.  Patient discharged in good condition with improved symptoms and after proving stability.   Final Clinical Impression(s) / ED Diagnoses Final diagnoses:  Dehydration  Acute nonintractable headache, unspecified headache type  Seizure-like activity (HCC)    Rx / DC Orders ED Discharge Orders  Ordered    Ambulatory referral to Neurology       Comments: An appointment is requested in approximately: 2 weeks   12/14/20 1208           Clinical Impression: 1. Dehydration   2. Acute nonintractable headache, unspecified headache type   3. Seizure-like activity (HCC)     Disposition: Discharge  Condition: Good  I have discussed the results, Dx and Tx plan with the pt(& family if present). He/she/they expressed understanding and agree(s) with the plan. Discharge instructions discussed at great length. Strict return precautions discussed and pt &/or family have verbalized understanding of the instructions. No further questions at time of discharge.    New Prescriptions   No medications on file    Follow Up: [facility] [address]     [address] [place] [address] [phone]       Vala Raffo, Canary Brim, MD 12/14/20 1210

## 2020-12-14 NOTE — ED Notes (Signed)
Pt verbalized understanding of dc instructions.

## 2020-12-14 NOTE — Discharge Instructions (Addendum)
Your history, exam, work-up today were overall reassuring.  I suspect your dehydration and headache led to the seizure-like episodes overnight prompting her evaluation.  You had improvement in her symptoms after medications and fluids and your work-up was overall reassuring.  We do want you to follow-up with outpatient neurology and placed an amatory referral.  Please call to schedule appointment if they do not call you to set 1 up.  Please rest and stay hydrated.  Please try to get some sleep.  If any symptoms change or worsen, please turn to the nearest emergency department.

## 2020-12-14 NOTE — ED Notes (Signed)
X-ray at bedside

## 2020-12-15 LAB — URINE CULTURE: Culture: NO GROWTH

## 2020-12-20 ENCOUNTER — Other Ambulatory Visit: Payer: Self-pay

## 2020-12-20 ENCOUNTER — Ambulatory Visit (INDEPENDENT_AMBULATORY_CARE_PROVIDER_SITE_OTHER): Payer: Medicaid Other | Admitting: Family Medicine

## 2020-12-20 VITALS — BP 115/58 | Ht 76.0 in | Wt 279.0 lb

## 2020-12-20 DIAGNOSIS — M25562 Pain in left knee: Secondary | ICD-10-CM | POA: Diagnosis present

## 2020-12-20 DIAGNOSIS — M545 Low back pain, unspecified: Secondary | ICD-10-CM

## 2020-12-20 DIAGNOSIS — G8929 Other chronic pain: Secondary | ICD-10-CM | POA: Insufficient documentation

## 2020-12-20 NOTE — Progress Notes (Signed)
Craig Carlson is a 20 y.o. male who presents to [facility] today for the following:  Low Back Pain Occurring for many years Feels numbness in his low back, no tingling "You could punch me in my back and I wouldn't feel it" Has done PT previously without improvement When it first occurred, he would have numbness only after running for some time He states that at that time it would start as a painful, pulling sensation, then turn numb He states that for the last 3 years he has been having constant numbness in his low back between L1 and L5 bilaterally Denies any saddle anesthesias, change in bowel or bladder function, fevers, radicular symptoms He does state that he feels his left leg is weaker than his right leg but denies any foot drop  Left knee pain Patient reports that for the last year and a half he has noticed occasional locking and giving out of his left knee Denies prior patellar dislocation He states that he has had various sprains and twisting injuries in football of his left knee, but never saw a doctor for them He states that his pain is usually around his patella, mostly on the lateral side He states he has pain that is worse when going up and down stairs, after sitting for long periods of time, after standing for long periods of time, also has pain with deep squatting Overall he is going to have these issues checked out and tried to resolve them so that he can get back to playing football and basketball   PMH reviewed.  ROS as above. Medications reviewed.  Exam:  BP (!) 115/58   Ht 6\' 4"  (1.93 m)   Wt 279 lb (126.6 kg)   BMI 33.96 kg/m  Gen: Well NAD MSK:  Left Knee: - Inspection: no gross deformity b/l. Mild genu valgus.  No swelling/effusion, erythema or bruising b/l. Skin intact - Palpation: TTP left lateral joint line and lateral aspect of patella, mild TTP left quad tendon - ROM: full active ROM with flexion and extension in knee and hip b/l, pain with  extreme of flexion - Strength: 5/5 strength b/l aside - Neuro/vasc: NV intact distally b/l - Special Tests: - LIGAMENTS: negative anterior and posterior drawer, negative Lachman's, no MCL or LCL laxity b/l -- MENISCUS: equivocal McMurray's, equivocal Thessaly on left -- PF JOINT: nml patellar mobility bilaterally.  Positive Clarke's on left, negative patellar apprehension  Hips: normal ROM, negative FABER and FADIR bilaterally  Lumbar spine: - Inspection: no gross deformity or asymmetry, swelling or ecchymosis - Palpation: No TTP over the spinous processes, paraspinal muscles, or SI joints b/l, sensation intact to light touch in L1-L5 region - ROM: full active ROM of the lumbar spine in flexion and extension, reports pain with extreme extension - Strength: 5/5 strength of lower extremity in L4-S1 nerve root distributions b/l; normal gait - Neuro: sensation intact in the L4-S1 nerve root distribution b/l, 1+ L4 and S1 reflexes b/l - Special testing: Negative straight leg raise, negative Stork test, Negative FABER   Assessment and Plan: 1) Acute pain of left knee 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  He does have genu valgus on exam, likely contributing.  No concern for ligamentous injury.  Otherwise, exam is very reassuring.  Will send to formal PT and give patellar stabilizer to use given his feeling of instability.  No concern for MPFL or retinaculum injury on exam, but may allow his patellar tracking to  improve.  F/U 6 weeks.  Low back pain Most likely related to core weakness.  No red flag symptoms.  His exam is also reassuring.  The location of numbness does not correspond with dermatomal distripution and unlikely to be related to disc rupture or nerve impingement.  He will be following up with neurology, therefore advised that he bring this up with them.  Will do formal PT and have him f/u in 6 weeks.  If not improving, could consider XR at that  time.   Luis Abed, D.O.  PGY-4 [facility]  12/20/2020 5:04 PM

## 2020-12-20 NOTE — Patient Instructions (Signed)
Thank you for coming to see me today. It was a pleasure. Today we talked about:   Bring up your back numbness to your neurologist.   We will have you do PT for your back and your knee.  You can use a knee brace to help keep your knee cap in the proper area.  Please follow-up with [place] in 6 weeks.  If you have any questions or concerns, please do not hesitate to call the office at [phone].  Best,   Luis Abed, DO [facility]

## 2020-12-20 NOTE — Assessment & Plan Note (Signed)
Most likely related to core weakness.  No red flag symptoms.  His exam is also reassuring.  The location of numbness does not correspond with dermatomal distripution and unlikely to be related to disc rupture or nerve impingement.  He will be following up with neurology, therefore advised that he bring this up with them.  Will do formal PT and have him f/u in 6 weeks.  If not improving, could consider XR at that time.

## 2020-12-20 NOTE — Assessment & Plan Note (Signed)
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  He does have genu valgus on exam, likely contributing.  No concern for ligamentous injury.  Otherwise, exam is very reassuring.  Will send to formal PT and give patellar stabilizer to use given his feeling of instability.  No concern for MPFL or retinaculum injury on exam, but may allow his patellar tracking to improve.  F/U 6 weeks.

## 2020-12-21 ENCOUNTER — Encounter: Payer: Self-pay | Admitting: Family Medicine

## 2020-12-25 ENCOUNTER — Ambulatory Visit: Payer: Medicaid Other | Attending: Family Medicine

## 2020-12-26 ENCOUNTER — Ambulatory Visit: Payer: Medicaid Other | Admitting: Medical-Surgical

## 2020-12-26 DIAGNOSIS — Z7689 Persons encountering health services in other specified circumstances: Secondary | ICD-10-CM

## 2020-12-31 ENCOUNTER — Emergency Department (HOSPITAL_COMMUNITY): Payer: No Typology Code available for payment source

## 2020-12-31 ENCOUNTER — Encounter (HOSPITAL_COMMUNITY): Payer: Self-pay | Admitting: Emergency Medicine

## 2020-12-31 ENCOUNTER — Emergency Department (HOSPITAL_COMMUNITY)
Admission: EM | Admit: 2020-12-31 | Discharge: 2020-12-31 | Disposition: A | Discharge status: Home / Self Care | Payer: No Typology Code available for payment source | Attending: Emergency Medicine | Admitting: Emergency Medicine

## 2020-12-31 ENCOUNTER — Other Ambulatory Visit: Payer: Self-pay

## 2020-12-31 DIAGNOSIS — M542 Cervicalgia: Secondary | ICD-10-CM | POA: Diagnosis not present

## 2020-12-31 DIAGNOSIS — S0990XA Unspecified injury of head, initial encounter: Secondary | ICD-10-CM | POA: Insufficient documentation

## 2020-12-31 DIAGNOSIS — Y990 Civilian activity done for income or pay: Secondary | ICD-10-CM | POA: Insufficient documentation

## 2020-12-31 MED ORDER — ACETAMINOPHEN 500 MG PO TABS
1000.0000 mg | ORAL_TABLET | Freq: Once | ORAL | Status: AC
  Administered 2020-12-31: 1000 mg via ORAL
  Filled 2020-12-31: qty 2
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
## 2020-12-31 NOTE — Discharge Instructions (Signed)
Take ibuprofen 3 times a day with meals as needed for pain and swelling.  Do not take other anti-inflammatories at the same time (Advil, Motrin, naproxen, Aleve). You may supplement with Tylenol if you need further pain control. Use ice packs to help with pain and swelling.  Return to the ER if you develop severe worsening headaches, vision changes, numbness, or any new worsening, or concerning symptoms.
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
## 2021-01-05 ENCOUNTER — Ambulatory Visit (INDEPENDENT_AMBULATORY_CARE_PROVIDER_SITE_OTHER): Payer: Medicaid Other | Admitting: Family Medicine

## 2021-01-05 ENCOUNTER — Encounter: Payer: Self-pay | Admitting: Family Medicine

## 2021-01-05 ENCOUNTER — Other Ambulatory Visit: Payer: Self-pay | Admitting: Family Medicine

## 2021-01-05 ENCOUNTER — Other Ambulatory Visit: Payer: Self-pay

## 2021-01-05 VITALS — BP 120/80 | HR 88 | Temp 98.4°F | Ht 76.0 in | Wt 239.0 lb

## 2021-01-05 DIAGNOSIS — G40909 Epilepsy, unspecified, not intractable, without status epilepticus: Secondary | ICD-10-CM | POA: Diagnosis not present

## 2021-01-05 DIAGNOSIS — Z87820 Personal history of traumatic brain injury: Secondary | ICD-10-CM | POA: Diagnosis not present

## 2021-01-05 DIAGNOSIS — S060X0A Concussion without loss of consciousness, initial encounter: Secondary | ICD-10-CM | POA: Diagnosis not present

## 2021-01-05 NOTE — Patient Instructions (Signed)
-   Obtain blood work with order provided - Take rest, sleep when needed, symptom-based light activity (hold from athletics), stay hydrated - Out of work until medically cleared - Consider Omega 3 supplementation - Return in 1 week, contact for questions

## 2021-01-08 ENCOUNTER — Ambulatory Visit: Payer: Medicaid Other | Admitting: Family Medicine

## 2021-01-08 ENCOUNTER — Encounter: Payer: Self-pay | Admitting: Family Medicine

## 2021-01-08 DIAGNOSIS — S060X0A Concussion without loss of consciousness, initial encounter: Secondary | ICD-10-CM | POA: Insufficient documentation

## 2021-01-08 DIAGNOSIS — G40909 Epilepsy, unspecified, not intractable, without status epilepticus: Secondary | ICD-10-CM | POA: Insufficient documentation

## 2021-01-08 NOTE — Assessment & Plan Note (Signed)
I have discussed the nature of concussion, predictors of persistent or severe symptoms with his factors being multiple concussions and seizure disorder. We have also ordered a neurofilament light chain serum study for trending.

## 2021-01-08 NOTE — Assessment & Plan Note (Signed)
Childhood seizure disorder unspecified, was told to maintain follow-up with neurology and has not had a chance to do so. A referral was placed today.

## 2021-01-08 NOTE — Assessment & Plan Note (Signed)
Concussion concern, outside imaging reports reviewed and SCAT5 testing performed independently today. This included administration, scoring, and interpretation of SCAT5 today for 32 minutes. I have ordered neurogilament light chain serum study as well as provided guidance. He can consider omega supplementation, he is to remain out of work, and engage in relative rest. He will return in 1 week for reevalaution.

## 2021-01-09 ENCOUNTER — Encounter: Payer: Self-pay | Admitting: Family Medicine

## 2021-01-09 LAB — NEUROFILAMENT LIGHT CHAIN: Neurofilament Light Chain: 0.92 pg/mL (ref 0.00–1.59)
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
## 2021-01-11 ENCOUNTER — Other Ambulatory Visit: Payer: Self-pay | Admitting: Family Medicine

## 2021-01-12 ENCOUNTER — Other Ambulatory Visit: Payer: Self-pay

## 2021-01-12 ENCOUNTER — Encounter: Payer: Self-pay | Admitting: Family Medicine

## 2021-01-12 ENCOUNTER — Ambulatory Visit (INDEPENDENT_AMBULATORY_CARE_PROVIDER_SITE_OTHER): Payer: Medicaid Other | Admitting: Family Medicine

## 2021-01-12 VITALS — BP 108/78 | HR 82 | Temp 98.3°F | Ht 76.0 in | Wt 236.0 lb

## 2021-01-12 DIAGNOSIS — S060X0D Concussion without loss of consciousness, subsequent encounter: Secondary | ICD-10-CM | POA: Diagnosis not present

## 2021-01-12 LAB — NEUROFILAMENT LIGHT CHAIN: Neurofilament Light Chain: 1.37 pg/mL (ref 0.00–1.59)

## 2021-01-12 MED ORDER — TRAZODONE HCL 50 MG PO TABS: 25.0000 mg | ORAL_TABLET | Freq: Every evening | ORAL | 0 refills | Status: DC | PRN

## 2021-01-12 NOTE — Patient Instructions (Signed)
-   Obtain new labs today - Obtain repeat labs on 01/18/2021 (next Thursday) - Return for follow-up on 01/22/2021 (next Monday) - Between now and then focus on heavy activity restrictions as discussed (avoid any physical activity except for light walking, interactive activities-video games, phone, and limit TV watching to 1 hour separated by 1 hour breaks) - Can dose half to 1 tablet of trazodone nightly to help with sleep on an as-needed basis - Drink plenty of fluids (water) try to minimize processed foods, and avoid caffeine/smoke exposure - Remain out of work until medically cleared (note provider) - Return for follow-up as scheduled, contact for questions between now
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
## 2021-01-12 NOTE — Assessment & Plan Note (Signed)
Patient with interval significant symptom worsening, this is attributable to level of activity performed over interval visits such as through hour-long sessions of videogames, working on Insurance account manager, multiple meetings with friends, etc.  His total number of symptoms have increased and symptom severity score has increased as well.  Recent neurofilament light chain in normal range, this will be trended with repeat labs today and for next Thursday.  I have reviewed the nature of concussion and risk factors that can predispose to worsened severity and longer timeline for concussion.  He does vocalize understanding and is amenable to stricter activity restrictions.  I have also written for trazodone 1/2 to 1 tablet nightly on an as-needed basis for insomnia due to concussion.  He will remain out of work until medically cleared, next reevaluation on 8/22.

## 2021-01-15 NOTE — Progress Notes (Signed)
Craig Carlson, the latest lab numbers are consistent with your worsened symptoms, I anticipate that with really locking down and resting your brain we should hope to see the numbers drop back down.

## 2021-01-16 ENCOUNTER — Encounter: Payer: Self-pay | Admitting: Family Medicine

## 2021-01-19 ENCOUNTER — Ambulatory Visit: Payer: Medicaid Other | Admitting: Neurology

## 2021-01-23 ENCOUNTER — Encounter: Payer: Self-pay | Admitting: Family Medicine

## 2021-01-23 ENCOUNTER — Telehealth: Payer: Medicaid Other | Admitting: Emergency Medicine

## 2021-01-23 DIAGNOSIS — R300 Dysuria: Secondary | ICD-10-CM

## 2021-01-23 NOTE — Progress Notes (Signed)
E-Visit for Urinary Problems  Based on what you shared with me, I feel your condition warrants further evaluation and I recommend that you be seen for a face to face office visit.  Male bladder infections are not very common.  We worry about prostate or kidney conditions.  The standard of care is to examine the abdomen and kidneys, and to do a urine and blood test to make sure that something more serious is not going on.  We recommend that you see a provider today.  If your doctor's office is closed [facility] has the following Urgent Cares:    NOTE: You will not be charged for this e-visit.  If you are having a true medical emergency please call 911.       For an urgent face to face visit, [facility] has six urgent care centers for your convenience:     [facility] [place] Get Driving Directions [phone] [address] , [place] [address]    [facility] ([place]) Get Driving Directions [phone] [address] [place], [place] [address]  [facility] ([place]) Get Driving Directions [phone] [address] [place],  [place]  [address]  [facility] Get Driving Directions [phone] [address] [place], [place] [address]   [facility] Get Driving Directions  [phone] [address] [place], [place] [address]   [facility] [place] Get Driving Directions [phone] [address] [place], [place] [address]  Your MyChart E-visit questionnaire answers were reviewed by a board certified advanced clinical practitioner to complete your personal care plan based on your specific symptoms.  Thank you for using e-Visits.        Approximately 5 minutes was used in reviewing the patient's chart, questionnaire, prescribing  medications, and documentation.  

## 2021-01-24 ENCOUNTER — Other Ambulatory Visit: Payer: Self-pay

## 2021-01-24 ENCOUNTER — Encounter: Payer: Self-pay | Admitting: Family Medicine

## 2021-01-24 ENCOUNTER — Ambulatory Visit (INDEPENDENT_AMBULATORY_CARE_PROVIDER_SITE_OTHER): Payer: Medicaid Other | Admitting: Family Medicine

## 2021-01-24 VITALS — BP 112/68 | HR 78 | Temp 98.1°F | Ht 76.0 in | Wt 239.0 lb

## 2021-01-24 DIAGNOSIS — S060X0D Concussion without loss of consciousness, subsequent encounter: Secondary | ICD-10-CM

## 2021-01-24 NOTE — Patient Instructions (Addendum)
-   Obtain blood test with order provided - Start concussion rehab, referral coordinator will contact you to schedule - Continue to remain out of work until medically cleared - Refrain from physical activity other than light walking and avoid videogames - Use symptoms as a guide for other light activities - Our office will contact you with lab results and discuss next steps

## 2021-01-24 NOTE — Assessment & Plan Note (Addendum)
This is an acute issue with systemic symptoms. Patient has demonstrated scat 5 testing that has improved over interval visit however close to initial visit on 01/05/2021 which is concerning given the time since injury.  This can be at least partially attributable to compliance with activity restrictions, he has done a better job of this over the interval visit, we do have neurofilament light chain study ordered, he will obtain these today to trend previous values.  Once values show appropriate downward trend, plan for return to work advised.  I also placed a referral for concussion rehab/vestibular training. Additionally, I have advised him on Rx trazodone management and how to wean / reserve usage for significant issues with sleep.  We will coordinate a follow-up pending lab results to determine next steps.  Work note was provided for remain off work and medically cleared.  I spent 17 minutes administering and scoring the neuropsychological concussion test.

## 2021-01-24 NOTE — Progress Notes (Signed)
     Primary Care / Sports Medicine Office Visit  Patient Information:  Patient ID: Craig Carlson, male DOB: [birth year] Age: 20 y.o. MRN: [id number]   Craig Carlson is a pleasant 20 y.o. male presenting with the following:  Chief Complaint  Patient presents with   Concussion without loss of consciousness    Review of Systems pertinent details above   Patient Active Problem List   Diagnosis Date Noted   Concussion with no loss of consciousness 01/08/2021   Seizure disorder (HCC) 01/08/2021   Hx of multiple concussions 01/05/2021   Acute pain of left knee 12/20/2020   Low back pain 12/20/2020   Past Medical History:  Diagnosis Date   Anxiety    Depression    Seizures (HCC)    Outpatient Encounter Medications as of 01/24/2021  Medication Sig   traZODone (DESYREL) 50 MG tablet Take 0.5-1 tablets (25-50 mg total) by mouth at bedtime as needed for sleep.   No facility-administered encounter medications on file as of 01/24/2021.   History reviewed. No pertinent surgical history.  Vitals:   01/24/21 1006  BP: 112/68  Pulse: 78  Temp: 98.1 F (36.7 C)  SpO2: 97%   Vitals:   01/24/21 1006  Weight: 239 lb (108.4 kg)  Height: 6\' 4"  (1.93 m)   Body mass index is 29.09 kg/m.    Independent interpretation of notes and tests performed by another provider:   None  Procedures performed:   None  Pertinent History, Exam, Impression, and Recommendations:   SCAT5: Total number of symptoms:  7/22 Symptom severity score:  10/132  What month is it? What is the date today? What is the day of the week? What year is it? What time is it right now? (within 1 hour) Cognitive assessment: 5/5   Immediate memory score: 11/15    Concentration score:  2/5 (Digits + Months)   Neck exam:    - (positive or negative) Coordination exam:  - (positive or negative)   Balance exam:   4/30   Delayed recall score  2/5   Concussion with no loss of  consciousness This is an acute issue with systemic symptoms. Patient has demonstrated scat 5 testing that has improved over interval visit however close to initial visit on 01/05/2021 which is concerning given the time since injury.  This can be at least partially attributable to compliance with activity restrictions, he has done a better job of this over the interval visit, we do have neurofilament light chain study ordered, he will obtain these today to trend previous values.  Once values show appropriate downward trend, plan for return to work advised.  I also placed a referral for concussion rehab/vestibular training. Additionally, I have advised him on Rx trazodone management and how to wean / reserve usage for significant issues with sleep.  We will coordinate a follow-up pending lab results to determine next steps.  Work note was provided for remain off work and medically cleared.  I spent 17 minutes administering and scoring the neuropsychological concussion test.    Orders & Medications No orders of the defined types were placed in this encounter.  Orders Placed This Encounter  Procedures   Ambulatory referral to Physical Therapy      Return for annual physical.     03/07/2021, MD   Primary Care Sports Medicine [facility] [place]

## 2021-01-24 NOTE — Telephone Encounter (Signed)
For your information. Will write in appointment notes.

## 2021-01-25 ENCOUNTER — Encounter: Payer: Self-pay | Admitting: Family Medicine

## 2021-01-25 ENCOUNTER — Ambulatory Visit (INDEPENDENT_AMBULATORY_CARE_PROVIDER_SITE_OTHER): Payer: Medicaid Other | Admitting: Family Medicine

## 2021-01-25 VITALS — BP 112/72 | HR 79 | Temp 98.1°F | Ht 76.0 in | Wt 236.0 lb

## 2021-01-25 DIAGNOSIS — M545 Low back pain, unspecified: Secondary | ICD-10-CM

## 2021-01-25 DIAGNOSIS — G8929 Other chronic pain: Secondary | ICD-10-CM

## 2021-01-25 DIAGNOSIS — Z0000 Encounter for general adult medical examination without abnormal findings: Secondary | ICD-10-CM

## 2021-01-25 DIAGNOSIS — Z113 Encounter for screening for infections with a predominantly sexual mode of transmission: Secondary | ICD-10-CM | POA: Diagnosis not present

## 2021-01-25 DIAGNOSIS — Z23 Encounter for immunization: Secondary | ICD-10-CM

## 2021-01-25 DIAGNOSIS — Z13220 Encounter for screening for lipoid disorders: Secondary | ICD-10-CM

## 2021-01-25 DIAGNOSIS — R7989 Other specified abnormal findings of blood chemistry: Secondary | ICD-10-CM

## 2021-01-25 LAB — NEUROFILAMENT LIGHT CHAIN: Neurofilament Light Chain: 1.08 pg/mL (ref 0.00–1.59)

## 2021-01-25 NOTE — Telephone Encounter (Signed)
Please advise 

## 2021-01-25 NOTE — Progress Notes (Signed)
Annual Physical Exam Visit  Patient Information:  Patient ID: Craig Carlson, male DOB: [birth year] Age: 20 y.o. MRN: [id number]   Subjective:   CC: Annual Physical Exam  HPI:  Craig Carlson is here for their annual physical.  I reviewed the past medical history, family history, social history, surgical history, and allergies today and changes were made as necessary.  Please see the problem list section below for additional details.  Health Habits Eye Exam: Last time earlier this year, rec once annually Dental Exam: few weeks prior, rec every 6 months Exercise: walking 30 min, 2-3 days Diet: No dietary restrictions, has bene overeating as of late, mostly home foods  Sexual Health Sexually active: yes, one partner Current contraception: no contraception (actively trying to conceive)   Past Medical History: Past Medical History:  Diagnosis Date   Anxiety    Depression    Seizures (HCC)    Past Surgical History: History reviewed. No pertinent surgical history. Social History: Social History   Socioeconomic History   Marital status: Single    Spouse name: Not on file   Number of children: 0   Years of education: 12   Highest education level: High school graduate  Occupational History   Not on file  Tobacco Use   Smoking status: Never   Smokeless tobacco: Never  Vaping Use   Vaping Use: Never used  Substance and Sexual Activity   Alcohol use: Yes    Alcohol/week: 1.0 - 2.0 standard drink    Types: 1 - 2 Standard drinks or equivalent per week   Drug use: Yes    Types: Marijuana   Sexual activity: Yes    Partners: Female    Birth control/protection: None  Other Topics Concern   Not on file  Social History Narrative   Not on file   Social Determinants of Health   Financial Resource Strain: Not on file  Food Insecurity: Not on file  Transportation Needs: Not on file  Physical Activity: Not on file  Stress: Not on file  Social  Connections: Not on file   Family History: Family History  Problem Relation Age of Onset   Diabetes Mother    Diabetes Father    Cancer Maternal Aunt    Diabetes Maternal Grandmother    Heart attack Maternal Grandmother    Diabetes Paternal Grandmother    Allergies: No Known Allergies Health Maintenance: Health Maintenance  Topic Date Due   HIV Screening  Never done   Hepatitis C Screening  Never done   INFLUENZA VACCINE  01/01/2021   HPV VACCINES (1 - Male 2-dose series) 01/05/2022 (Originally 05/03/2012)   TETANUS/TDAP  01/26/2031   COVID-19 Vaccine  Completed   Pneumococcal Vaccine 38-67 Years old  Aged Out    HM Colonoscopy     This patient has no relevant Health Maintenance data.      Medications: Current Outpatient Medications on File Prior to Visit  Medication Sig Dispense Refill   traZODone (DESYREL) 50 MG tablet Take 0.5-1 tablets (25-50 mg total) by mouth at bedtime as needed for sleep. 14 tablet 0   No current facility-administered medications on file prior to visit.    Review of Systems: No headache, visual changes, nausea, vomiting, diarrhea, constipation, dizziness, +abdominal pain, skin rash, fevers, chills, night sweats, swollen lymph nodes, weight loss, chest pain, body aches, joint swelling, muscle aches, +low back pain, shortness of breath, mood changes, visual or auditory hallucinations reported.  Objective:  Vitals:   01/25/21 0912  BP: 112/72  Pulse: 79  Temp: 98.1 F (36.7 C)  SpO2: 98%   Vitals:   01/25/21 0912  Weight: 236 lb (107 kg)  Height: 6\' 4"  (1.93 m)   Body mass index is 28.73 kg/m.  General: Well Developed, well nourished, and in no acute distress.  Neuro: Alert and oriented x3, extra-ocular muscles intact, sensation grossly intact. Cranial nerves II through XII are intact, motor, sensory, and coordinative functions are all intact. HEENT: Normocephalic, atraumatic, pupils equal round reactive to light, neck supple, no  masses, no lymphadenopathy, thyroid nonpalpable. Oropharynx, nasopharynx, external ear canals are unremarkable. Skin: Warm and dry, no rashes noted.  Cardiac: Regular rate and rhythm, no murmurs rubs or gallops. No peripheral edema. Pulses symmetric. Respiratory: Clear to auscultation bilaterally. Not using accessory muscles, speaking in full sentences.  Abdominal: Soft, nontender, nondistended, positive bowel sounds, no masses, no organomegaly. Genitourinary: Glans exposed and noted hyperpigmented patch at the distal tip. No lesions noted. No discharge. Nontender testes, cremasteric reflex intact, no masses with valsalva, mild suprapubic tenderness Musculoskeletal: Shoulder, elbow, wrist, hip, knee, ankle stable, and with full range of motion.   Impression and Recommendations:   The patient was counselled, risk factors were discussed, and anticipatory guidance given.  Encounter for screening examination for sexually transmitted disease Patient with few weeks history of penile glans pruritus, mild discomfort to the testes and suprapubic/lower abdominal region.  He is in a monogamous relationship, no current use of contraception as this significant other are attempting to conceive.  The symptoms occur randomly, denies any discharge, change in urine.  He has had similar symptoms in the past and had STI work-up through the urgent care which was negative.  He does endorse recent changes in soap.  Serum and urine studies ordered, advised change to previously used hygiene products (soap, etc.), will follow lab results once obtained.   Chronic low back pain Chronic issue with recent progression / worsening. Denies radiculopathy but his exam demonstrated diminished sensation throughout the left leg. He does have upcoming visit with Dr. given geographic preference and lumbar spine complete ordered in preparation for that visit and to further evaluate stated concerns. Will follow  peripherally.  Annual physical exam Annual physical examination complete, risk stratification labs ordered, anticipatory guidance provided.  We will follow the labs once resulted.  Tdap administered today.  He is amenable to flu vaccine once available.   Orders & Medications Medications: No orders of the defined types were placed in this encounter.  Orders Placed This Encounter  Procedures   C. trachomatis/N. gonorrhoeae RNA   DG Lumbar Spine Complete   Tdap vaccine greater than or equal to 7yo IM   RPR   HIV Antibody (routine testing w rflx)   HSV 2 antibody, IgG   Hepatitis C Antibody   TSH Rfx on Abnormal to Free T4   Comprehensive metabolic panel   CBC   Lipid panel   VITAMIN D 25 Hydroxy (Vit-D Deficiency, Fractures)   UA/M w/rflx Culture, Comp     Return in about 1 year (around 01/25/2022) for annual physical.    01/27/2022, MD   Primary Care Sports Medicine [facility] [place]

## 2021-01-25 NOTE — Progress Notes (Signed)
Please order an additional Neurofilament Light Chain (NFL) test for patient to obtain 9/2 or 9/5 and he will need a follow-up towards the end of the 9/5 week (9/8-9/9).

## 2021-01-25 NOTE — Assessment & Plan Note (Addendum)
Patient with few weeks history of penile glans pruritus, mild discomfort to the testes and suprapubic/lower abdominal region.  He is in a monogamous relationship, no current use of contraception as this significant other are attempting to conceive.  The symptoms occur randomly, denies any discharge, change in urine.  He has had similar symptoms in the past and had STI work-up through the urgent care which was negative.  He does endorse recent changes in soap.  Serum and urine studies ordered, advised change to previously used hygiene products (soap, etc.), will follow lab results once obtained.

## 2021-01-25 NOTE — Assessment & Plan Note (Addendum)
Chronic issue with recent progression / worsening. Denies radiculopathy but his exam demonstrated diminished sensation throughout the left leg. He does have upcoming visit with Dr. Pearletha Forge given geographic preference and lumbar spine complete ordered in preparation for that visit and to further evaluate stated concerns. Will follow peripherally.

## 2021-01-25 NOTE — Patient Instructions (Addendum)
-   Obtain fasting labs with orders provided (can have water or black coffee but otherwise no food or drink x 8 hours before labs) - Review information provided - Obtain low back x-ray - Office will contact you with results and next steps - Return in 1 year for physical - Contact us for questions between now and then

## 2021-01-25 NOTE — Assessment & Plan Note (Signed)
Annual physical examination complete, risk stratification labs ordered, anticipatory guidance provided.  We will follow the labs once resulted.  Tdap administered today.  He is amenable to flu vaccine once available.

## 2021-01-26 ENCOUNTER — Other Ambulatory Visit: Payer: Self-pay

## 2021-01-26 DIAGNOSIS — S060X0D Concussion without loss of consciousness, subsequent encounter: Secondary | ICD-10-CM

## 2021-01-27 ENCOUNTER — Encounter: Payer: Self-pay | Admitting: Family Medicine

## 2021-01-27 LAB — CBC
Hematocrit: 48.5 % (ref 37.5–51.0)
Hemoglobin: 16.1 g/dL (ref 13.0–17.7)
MCH: 27.1 pg (ref 26.6–33.0)
MCHC: 33.2 g/dL (ref 31.5–35.7)
MCV: 82 fL (ref 79–97)
Platelets: 255 10*3/uL (ref 150–450)
RBC: 5.94 x10E6/uL — ABNORMAL HIGH (ref 4.14–5.80)
RDW: 12.3 % (ref 11.6–15.4)
WBC: 5.9 10*3/uL (ref 3.4–10.8)

## 2021-01-27 LAB — COMPREHENSIVE METABOLIC PANEL
ALT: 31 IU/L (ref 0–44)
AST: 23 IU/L (ref 0–40)
Albumin/Globulin Ratio: 1.9 (ref 1.2–2.2)
Albumin: 4.8 g/dL (ref 4.1–5.2)
Alkaline Phosphatase: 118 IU/L (ref 51–125)
BUN/Creatinine Ratio: 8 — ABNORMAL LOW (ref 9–20)
BUN: 9 mg/dL (ref 6–20)
Bilirubin Total: 0.6 mg/dL (ref 0.0–1.2)
CO2: 20 mmol/L (ref 20–29)
Calcium: 9.6 mg/dL (ref 8.7–10.2)
Chloride: 101 mmol/L (ref 96–106)
Creatinine, Ser: 1.1 mg/dL (ref 0.76–1.27)
Globulin, Total: 2.5 g/dL (ref 1.5–4.5)
Glucose: 88 mg/dL (ref 65–99)
Potassium: 4.4 mmol/L (ref 3.5–5.2)
Sodium: 138 mmol/L (ref 134–144)
Total Protein: 7.3 g/dL (ref 6.0–8.5)
eGFR: 99 mL/min/{1.73_m2} (ref >59)

## 2021-01-27 LAB — RPR: RPR Ser Ql: NONREACTIVE

## 2021-01-27 LAB — LIPID PANEL
Chol/HDL Ratio: 3.8 ratio (ref 0.0–5.0)
Cholesterol, Total: 181 mg/dL — ABNORMAL HIGH (ref 100–169)
HDL: 48 mg/dL (ref >39)
LDL Chol Calc (NIH): 118 mg/dL — ABNORMAL HIGH (ref 0–109)
Triglycerides: 81 mg/dL (ref 0–89)
VLDL Cholesterol Cal: 15 mg/dL (ref 5–40)

## 2021-01-27 LAB — HSV 2 ANTIBODY, IGG: HSV 2 IgG, Type Spec: <0.91 index (ref 0.00–0.90)

## 2021-01-27 LAB — HEPATITIS C ANTIBODY: Hep C Virus Ab: <0.1 s/co ratio (ref 0.0–0.9)

## 2021-01-27 LAB — TSH RFX ON ABNORMAL TO FREE T4: TSH: 1.73 u[IU]/mL (ref 0.450–4.500)

## 2021-01-27 LAB — HIV ANTIBODY (ROUTINE TESTING W REFLEX): HIV Screen 4th Generation wRfx: NONREACTIVE

## 2021-01-27 LAB — VITAMIN D 25 HYDROXY (VIT D DEFICIENCY, FRACTURES): Vit D, 25-Hydroxy: 19.7 ng/mL — ABNORMAL LOW (ref 30.0–100.0)

## 2021-01-29 ENCOUNTER — Telehealth: Payer: Self-pay

## 2021-01-29 ENCOUNTER — Other Ambulatory Visit: Payer: Self-pay | Admitting: Family Medicine

## 2021-01-29 DIAGNOSIS — R7989 Other specified abnormal findings of blood chemistry: Secondary | ICD-10-CM

## 2021-01-29 MED ORDER — VITAMIN D (ERGOCALCIFEROL) 1.25 MG (50000 UNIT) PO CAPS
50000.0000 [IU] | ORAL_CAPSULE | ORAL | 0 refills | Status: DC
Start: 2021-01-29 — End: 2022-03-11

## 2021-01-29 NOTE — Telephone Encounter (Signed)
Please advise for RPR lab.

## 2021-01-29 NOTE — Progress Notes (Signed)
Craig Carlson, the labs we ordered were routine screening labs and the STI panel as requested. The STI (sexually transmitted infection panel) all came back normal. The tests were for syphilis (RPR), HIV, herpes, etc. It does look like some urine studies are still pending - did you give a urine sample at the lab?  The other screening labs are mainly significant for elevated cholesterol and low vitamin D. The vitamin D is low enough that I will send a Rx weekly dose of vitamin D to take until complete.  For the cholesterol, see the below recs:  Dietary changes to include reducing saturated fats, sodium, avoiding red meat, fried, processed foods, full-fat dairy, baked goods, and sweets. Incorporate more fruits, vegetables, fiber-rich foods such as whole grains, and transition to more eggs and lean meats. Make realistic changes where you can.

## 2021-01-29 NOTE — Telephone Encounter (Signed)
-----   Message from Jerrol Banana, MD sent at 01/25/2021  2:17 PM EDT ----- Please order an additional Neurofilament Light Chain (NFL) test for patient to obtain 9/2 or 9/5 and he will need a follow-up towards the end of the 9/5 week (9/8-9/9).

## 2021-01-30 ENCOUNTER — Ambulatory Visit: Payer: Medicaid Other | Admitting: Family Medicine

## 2021-01-30 LAB — CHLAMYDIA/GONOCOCCUS/TRICHOMONAS, NAA
Chlamydia by NAA: NEGATIVE
Gonococcus by NAA: NEGATIVE
Trich vag by NAA: NEGATIVE

## 2021-01-30 LAB — MICROSCOPIC EXAMINATION
Bacteria, UA: NONE SEEN
Casts: NONE SEEN /lpf
Epithelial Cells (non renal): NONE SEEN /hpf (ref 0–10)
RBC, Urine: NONE SEEN /hpf (ref 0–2)
WBC, UA: NONE SEEN /hpf (ref 0–5)

## 2021-01-30 LAB — UA/M W/RFLX CULTURE, COMP
Bilirubin, UA: NEGATIVE
Glucose, UA: NEGATIVE
Leukocytes,UA: NEGATIVE
Nitrite, UA: NEGATIVE
RBC, UA: NEGATIVE
Specific Gravity, UA: >=1.03 — AB (ref 1.005–1.030)
Urobilinogen, Ur: 1 mg/dL (ref 0.2–1.0)
pH, UA: 6.5 (ref 5.0–7.5)

## 2021-01-31 ENCOUNTER — Ambulatory Visit: Payer: Medicaid Other | Admitting: Family Medicine

## 2021-02-02 ENCOUNTER — Other Ambulatory Visit: Payer: Self-pay | Admitting: Family Medicine

## 2021-02-06 ENCOUNTER — Telehealth: Payer: Self-pay

## 2021-02-06 NOTE — Telephone Encounter (Signed)
-----   Message from Jerrol Banana, MD sent at 02/06/2021 11:57 AM EDT ----- Regarding: NFL serum test Please contact patient and ensure he goes to get this blood test that was ordered at his visit on 8/24 so that results will hopefully be in by his visit.

## 2021-02-06 NOTE — Telephone Encounter (Signed)
Spoke with patient who states he had his labs drawn 02/02/21.  Pending results.  Patient has appointment on 02/08/21.  For your information.

## 2021-02-07 LAB — NEUROFILAMENT LIGHT CHAIN: Neurofilament Light Chain: 1.05 pg/mL (ref 0.00–1.59)

## 2021-02-08 ENCOUNTER — Ambulatory Visit: Payer: Medicaid Other | Admitting: Family Medicine

## 2021-02-13 ENCOUNTER — Ambulatory Visit: Payer: Medicaid Other | Admitting: Family Medicine

## 2021-02-27 ENCOUNTER — Ambulatory Visit: Payer: Medicaid Other | Admitting: Family Medicine

## 2021-03-01 ENCOUNTER — Ambulatory Visit: Payer: Medicaid Other | Admitting: Family Medicine

## 2021-03-01 ENCOUNTER — Encounter: Payer: Self-pay | Admitting: Family Medicine

## 2021-04-29 ENCOUNTER — Encounter (HOSPITAL_COMMUNITY): Payer: Self-pay | Admitting: Emergency Medicine

## 2021-04-29 ENCOUNTER — Emergency Department (HOSPITAL_COMMUNITY)
Admission: EM | Admit: 2021-04-29 | Discharge: 2021-04-29 | Disposition: A | Discharge status: Home / Self Care | Payer: Medicaid Other | Attending: Emergency Medicine | Admitting: Emergency Medicine

## 2021-04-29 DIAGNOSIS — R1032 Left lower quadrant pain: Secondary | ICD-10-CM | POA: Diagnosis not present

## 2021-04-29 DIAGNOSIS — R109 Unspecified abdominal pain: Secondary | ICD-10-CM

## 2021-04-29 DIAGNOSIS — R112 Nausea with vomiting, unspecified: Secondary | ICD-10-CM | POA: Diagnosis not present

## 2021-04-29 DIAGNOSIS — Z20822 Contact with and (suspected) exposure to covid-19: Secondary | ICD-10-CM | POA: Diagnosis not present

## 2021-04-29 DIAGNOSIS — R1031 Right lower quadrant pain: Secondary | ICD-10-CM | POA: Diagnosis present

## 2021-04-29 LAB — CBC WITH DIFFERENTIAL/PLATELET
Abs Immature Granulocytes: 0.01 10*3/uL (ref 0.00–0.07)
Basophils Absolute: 0 10*3/uL (ref 0.0–0.1)
Basophils Relative: 0 %
Eosinophils Absolute: 0.3 10*3/uL (ref 0.0–0.5)
Eosinophils Relative: 4 %
HCT: 46.8 % (ref 39.0–52.0)
Hemoglobin: 15.7 g/dL (ref 13.0–17.0)
Immature Granulocytes: 0 %
Lymphocytes Relative: 44 %
Lymphs Abs: 3 10*3/uL (ref 0.7–4.0)
MCH: 28.2 pg (ref 26.0–34.0)
MCHC: 33.5 g/dL (ref 30.0–36.0)
MCV: 84.2 fL (ref 80.0–100.0)
Monocytes Absolute: 0.7 10*3/uL (ref 0.1–1.0)
Monocytes Relative: 10 %
Neutro Abs: 2.8 10*3/uL (ref 1.7–7.7)
Neutrophils Relative %: 42 %
Platelets: 262 10*3/uL (ref 150–400)
RBC: 5.56 MIL/uL (ref 4.22–5.81)
RDW: 12.9 % (ref 11.5–15.5)
WBC: 6.8 10*3/uL (ref 4.0–10.5)
nRBC: 0 % (ref 0.0–0.2)

## 2021-04-29 LAB — RESP PANEL BY RT-PCR (FLU A&B, COVID) ARPGX2
Influenza A by PCR: NEGATIVE
Influenza B by PCR: NEGATIVE
SARS Coronavirus 2 by RT PCR: NEGATIVE

## 2021-04-29 LAB — COMPREHENSIVE METABOLIC PANEL
ALT: 21 U/L (ref 0–44)
AST: 24 U/L (ref 15–41)
Albumin: 4.3 g/dL (ref 3.5–5.0)
Alkaline Phosphatase: 84 U/L (ref 38–126)
Anion gap: 6 (ref 5–15)
BUN: 12 mg/dL (ref 6–20)
CO2: 26 mmol/L (ref 22–32)
Calcium: 8.8 mg/dL — ABNORMAL LOW (ref 8.9–10.3)
Chloride: 104 mmol/L (ref 98–111)
Creatinine, Ser: 0.96 mg/dL (ref 0.61–1.24)
GFR, Estimated: >60 mL/min (ref >60)
Glucose, Bld: 100 mg/dL — ABNORMAL HIGH (ref 70–99)
Potassium: 4 mmol/L (ref 3.5–5.1)
Sodium: 136 mmol/L (ref 135–145)
Total Bilirubin: 0.6 mg/dL (ref 0.3–1.2)
Total Protein: 7.8 g/dL (ref 6.5–8.1)

## 2021-04-29 LAB — LIPASE, BLOOD: Lipase: 33 U/L (ref 11–51)

## 2021-04-29 LAB — URINALYSIS, ROUTINE W REFLEX MICROSCOPIC
Bilirubin Urine: NEGATIVE
Glucose, UA: NEGATIVE mg/dL
Hgb urine dipstick: NEGATIVE
Ketones, ur: NEGATIVE mg/dL
Leukocytes,Ua: NEGATIVE
Nitrite: NEGATIVE
Protein, ur: NEGATIVE mg/dL
Specific Gravity, Urine: 1.023 (ref 1.005–1.030)
pH: 5 (ref 5.0–8.0)

## 2021-04-29 MED ORDER — ONDANSETRON HCL 4 MG/2ML IJ SOLN
4.0000 mg | Freq: Once | INTRAMUSCULAR | Status: AC
  Administered 2021-04-29: 08:00:00 4 mg via INTRAVENOUS
  Filled 2021-04-29: qty 2

## 2021-04-29 MED ORDER — SUCRALFATE 1 G PO TABS: 1.0000 g | ORAL_TABLET | Freq: Three times a day (TID) | ORAL | 0 refills | Status: DC

## 2021-04-29 MED ORDER — KETOROLAC TROMETHAMINE 15 MG/ML IJ SOLN
15.0000 mg | Freq: Once | INTRAMUSCULAR | Status: AC
  Administered 2021-04-29: 08:00:00 15 mg via INTRAVENOUS
  Filled 2021-04-29: qty 1

## 2021-04-29 MED ORDER — SODIUM CHLORIDE 0.9 % IV BOLUS
1000.0000 mL | Freq: Once | INTRAVENOUS | Status: AC
  Administered 2021-04-29: 08:00:00 1000 mL via INTRAVENOUS

## 2021-04-29 MED ORDER — FAMOTIDINE IN NACL 20-0.9 MG/50ML-% IV SOLN
20.0000 mg | Freq: Once | INTRAVENOUS | Status: AC
  Administered 2021-04-29: 08:00:00 20 mg via INTRAVENOUS
  Filled 2021-04-29: qty 50

## 2021-04-29 MED ORDER — DICYCLOMINE HCL 20 MG PO TABS: 20.0000 mg | ORAL_TABLET | Freq: Two times a day (BID) | ORAL | 0 refills | Status: DC

## 2021-04-29 NOTE — ED Provider Notes (Signed)
[facility] DEPT Provider Note   CSN: [id number] Arrival date & time: 04/29/21  [id number]     History Chief Complaint  Patient presents with   Abdominal Pain    Craig Carlson is a 20 y.o. male.  This is a 20 y.o. male with significant medical history as below, including anxiety/depression who presents to the ED with complaint of abd pain.  Patient reports this morning he woke up with lower abdominal discomfort, single episode of emesis.  Pain is localized to his bilateral lower abdominal quadrants.  No pain to his upper abdomen.  Pain is aching, cramping, nonradiating, no ongoing nausea.  No further episodes of emesis (1x only).  No fevers or chills.  No change in bowel or bladder function, no change to oral intake or appetite.  No rashes.  No penile or testicular swelling.  No urethral discharge.  No concern for STI exposure.  The history is provided by the patient. No language interpreter was used.  Abdominal Pain Associated symptoms: nausea and vomiting   Associated symptoms: no chest pain, no chills, no cough, no fever, no hematuria and no shortness of breath       Past Medical History:  Diagnosis Date   Anxiety    Depression    Seizures ([place])     Patient Active Problem List   Diagnosis Date Noted   Encounter for screening examination for sexually transmitted disease 01/25/2021   Annual physical exam 01/25/2021   Concussion with no loss of consciousness 01/08/2021   Seizure disorder ([place]) 01/08/2021   Hx of multiple concussions 01/05/2021   Acute pain of left knee 12/20/2020   Chronic low back pain 12/20/2020    History reviewed. No pertinent surgical history.     Family History  Problem Relation Age of Onset   Diabetes Mother    Diabetes Father    Cancer Maternal Aunt    Diabetes Maternal Grandmother    Heart attack Maternal Grandmother    Diabetes Paternal Grandmother     Social History   Tobacco Use   Smoking status:  Never   Smokeless tobacco: Never  Vaping Use   Vaping Use: Never used  Substance Use Topics   Alcohol use: Yes    Alcohol/week: 1.0 - 2.0 standard drink    Types: 1 - 2 Standard drinks or equivalent per week   Drug use: Yes    Types: Marijuana    Home Medications Prior to Admission medications   Medication Sig Start Date End Date Taking? Authorizing Provider  dicyclomine (BENTYL) 20 MG tablet Take 1 tablet (20 mg total) by mouth 2 (two) times daily for 5 days. 04/29/21 05/04/21 Yes Wynona Dove A, DO  sucralfate (CARAFATE) 1 g tablet Take 1 tablet (1 g total) by mouth 4 (four) times daily -  with meals and at bedtime for 7 days. 04/29/21 05/06/21 Yes Jeanell Sparrow, DO  traZODone (DESYREL) 50 MG tablet Take 0.5-1 tablets (25-50 mg total) by mouth at bedtime as needed for sleep. Patient not taking: Reported on 04/29/2021 01/12/21   Montel Culver, MD  Vitamin D, Ergocalciferol, (DRISDOL) 1.25 MG (50000 UNIT) CAPS capsule Take 1 capsule (50,000 Units total) by mouth every 7 (seven) days. Take for 8 total doses(weeks) Patient not taking: Reported on 04/29/2021 01/29/21   Montel Culver, MD    Allergies    Patient has no known allergies.  Review of Systems   Review of Systems  Constitutional:  Negative for chills  and fever.  HENT:  Negative for facial swelling and trouble swallowing.   Eyes:  Negative for photophobia and visual disturbance.  Respiratory:  Negative for cough and shortness of breath.   Cardiovascular:  Negative for chest pain and palpitations.  Gastrointestinal:  Positive for abdominal pain, nausea and vomiting.  Endocrine: Negative for polydipsia and polyuria.  Genitourinary:  Negative for difficulty urinating and hematuria.  Musculoskeletal:  Negative for gait problem and joint swelling.  Skin:  Negative for pallor and rash.  Neurological:  Negative for syncope and headaches.  Psychiatric/Behavioral:  Negative for agitation and confusion.    Physical  Exam Updated Vital Signs BP 135/79   Pulse 77   Temp 98.5 F (36.9 C) (Oral)   Resp 16   SpO2 100%   Physical Exam Vitals and nursing note reviewed.  Constitutional:      General: He is not in acute distress.    Appearance: He is well-developed.  HENT:     Head: Normocephalic and atraumatic.     Right Ear: External ear normal.     Left Ear: External ear normal.     Mouth/Throat:     Mouth: Mucous membranes are moist.  Eyes:     General: No scleral icterus. Cardiovascular:     Rate and Rhythm: Normal rate and regular rhythm.     Pulses: Normal pulses.     Heart sounds: Normal heart sounds.  Pulmonary:     Effort: Pulmonary effort is normal. No respiratory distress.     Breath sounds: Normal breath sounds.  Abdominal:     General: Abdomen is flat.     Palpations: Abdomen is soft.     Tenderness: There is abdominal tenderness in the right lower quadrant and left lower quadrant. There is no right CVA tenderness, left CVA tenderness, guarding or rebound.     Comments: Minimal tenderness upon deep palpation  Musculoskeletal:        General: Normal range of motion.     Cervical back: Normal range of motion.     Right lower leg: No edema.     Left lower leg: No edema.  Skin:    General: Skin is warm and dry.     Capillary Refill: Capillary refill takes less than 2 seconds.  Neurological:     Mental Status: He is alert and oriented to person, place, and time.  Psychiatric:        Mood and Affect: Mood normal.        Behavior: Behavior normal.    ED Results / Procedures / Treatments   Labs (all labs ordered are listed, but only abnormal results are displayed) Labs Reviewed  COMPREHENSIVE METABOLIC PANEL - Abnormal; Notable for the following components:      Result Value   Glucose, Bld 100 (*)    Calcium 8.8 (*)    All other components within normal limits  RESP PANEL BY RT-PCR (FLU A&B, COVID) ARPGX2  URINALYSIS, ROUTINE W REFLEX MICROSCOPIC  CBC WITH  DIFFERENTIAL/PLATELET  LIPASE, BLOOD    EKG None  Radiology No results found.  Procedures Procedures   Medications Ordered in ED Medications  sodium chloride 0.9 % bolus 1,000 mL (0 mLs Intravenous Stopped 04/29/21 1041)  ondansetron (ZOFRAN) injection 4 mg (4 mg Intravenous Given 04/29/21 0743)  ketorolac (TORADOL) 15 MG/ML injection 15 mg (15 mg Intravenous Given 04/29/21 0743)  famotidine (PEPCID) IVPB 20 mg premix (0 mg Intravenous Stopped 04/29/21 0819)    ED Course  I  have reviewed the triage vital signs and the nursing notes.  Pertinent labs & imaging results that were available during my care of the patient were reviewed by me and considered in my medical decision making (see chart for details).    MDM Rules/Calculators/A&P                           CC: abd pain, nausea, emesis x1  This patient complains of above; this involves an extensive number of treatment options and is a complaint that carries with it a high risk of complications and morbidity. Vital signs were reviewed. Serious etiologies considered.  Record review:  Previous records obtained and reviewed   Work up as above, notable for:  Labs & imaging results that were available during my care of the patient were reviewed by me and considered in my medical decision making.   Management: IVF, antiemetics, pepcid, toradol.  Reassessment: Labs stable   Pt does report that he is feeling much better. He verbalized a concern about some mild pain to his testicle over the past few weeks but is currently asymptomatic. He refused GU exam. I recommended that he f/u with PCP regarding this complaint or RTED if symptoms worsen or if he would like to have this evaluated.    The patient improved significantly and was discharged in stable condition. Detailed discussions were had with the patient regarding current findings, and need for close f/u with PCP or on call doctor. The patient has been instructed to return  immediately if the symptoms worsen in any way for re-evaluation. Patient verbalized understanding and is in agreement with current care plan. All questions answered prior to discharge.           This chart was dictated using voice recognition software.  Despite best efforts to proofread,  errors can occur which can change the documentation meaning.  Final Clinical Impression(s) / ED Diagnoses Final diagnoses:  Abdominal pain, unspecified abdominal location    Rx / DC Orders ED Discharge Orders          Ordered    dicyclomine (BENTYL) 20 MG tablet  2 times daily        04/29/21 1031    sucralfate (CARAFATE) 1 g tablet  3 times daily with meals & bedtime        04/29/21 1031             Jeanell Sparrow, DO 04/29/21 1605

## 2021-04-29 NOTE — ED Triage Notes (Signed)
PT c/o lower abdominal pain with one episode of vomiting since this morning. Denies diarrhea.

## 2021-04-29 NOTE — Discharge Instructions (Addendum)
The etiology of your abdominal pain is unclear.    There are many causes of abdominal pain. Most pain is not serious and goes away, but some pain gets worse, changes, or will not go away. Please return to the emergency department or see your doctor right away if you (or your family member) experience any of the following:  1. Pain that gets worse or moves to just one spot.  2. Pain that gets worse if you cough or sneeze.  3. Pain with going over a bump in the road.  4. Pain that does not get better in 24 hours.  5. Inability to keep down liquids (vomiting)-especially if you are making less urine.  6. Fainting.  7. Blood in the vomit or stool.  8. High fever or shaking chills.  9. Swelling of the abdomen.  10. Any new or worsening problem.      Follow-up Instructions  See your primary care provider if not completely better in the next 2-3 days. Come to the ED if you are unable to see them in this time frame.    Additional Instructions  No alcohol.  No caffeine, aspirin, or cigarettes.   Please return to the emergency department immediately for any new or concerning symptoms, or if you get worse.  

## 2021-05-23 ENCOUNTER — Encounter (HOSPITAL_BASED_OUTPATIENT_CLINIC_OR_DEPARTMENT_OTHER): Payer: Self-pay

## 2021-05-23 ENCOUNTER — Emergency Department (HOSPITAL_BASED_OUTPATIENT_CLINIC_OR_DEPARTMENT_OTHER): Payer: Medicaid Other

## 2021-05-23 ENCOUNTER — Other Ambulatory Visit: Payer: Self-pay

## 2021-05-23 ENCOUNTER — Emergency Department (HOSPITAL_BASED_OUTPATIENT_CLINIC_OR_DEPARTMENT_OTHER)
Admission: EM | Admit: 2021-05-23 | Discharge: 2021-05-23 | Disposition: A | Discharge status: Home / Self Care | Payer: Medicaid Other | Attending: Emergency Medicine | Admitting: Emergency Medicine

## 2021-05-23 ENCOUNTER — Emergency Department (HOSPITAL_BASED_OUTPATIENT_CLINIC_OR_DEPARTMENT_OTHER): Payer: Medicaid Other | Admitting: Radiology

## 2021-05-23 DIAGNOSIS — S069X0A Unspecified intracranial injury without loss of consciousness, initial encounter: Secondary | ICD-10-CM | POA: Diagnosis present

## 2021-05-23 DIAGNOSIS — Y92410 Unspecified street and highway as the place of occurrence of the external cause: Secondary | ICD-10-CM | POA: Insufficient documentation

## 2021-05-23 MED ORDER — OXYCODONE-ACETAMINOPHEN 5-325 MG PO TABS
2.0000 | ORAL_TABLET | Freq: Once | ORAL | Status: AC
  Administered 2021-05-23: 14:00:00 2 via ORAL
  Filled 2021-05-23: qty 2

## 2021-05-23 MED ORDER — KETOROLAC TROMETHAMINE 30 MG/ML IJ SOLN
30.0000 mg | Freq: Once | INTRAMUSCULAR | Status: DC
  Filled 2021-05-23: qty 1
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
## 2021-05-23 NOTE — ED Notes (Signed)
Asked pt pain level and he answered it was ok and did not give a number. I asked the Pt is he wanted the pain medicaiton that the Md had ordered and the Pt stated that he didn't want it.
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
## 2021-05-23 NOTE — ED Notes (Signed)
Patient transported to X-ray 

## 2021-05-23 NOTE — ED Notes (Signed)
Patient ambulated well with the crutches.

## 2021-05-23 NOTE — ED Notes (Signed)
RN provided AVS using Teachback Method. Patient verbalizes understanding of Discharge Instructions. Opportunity for Questioning and Answers were provided by RN. Patient Discharged from ED ambulatory to Home with Crutches.  

## 2021-06-14 ENCOUNTER — Encounter: Payer: Self-pay | Admitting: Family Medicine

## 2021-06-14 NOTE — Telephone Encounter (Signed)
Ok to refer him but note last year made it sound like he was going to see one or that he already had one?
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
## 2021-06-18 ENCOUNTER — Ambulatory Visit: Payer: Medicaid Other | Admitting: Sports Medicine

## 2021-06-18 ENCOUNTER — Other Ambulatory Visit: Payer: Self-pay

## 2021-06-18 DIAGNOSIS — M545 Low back pain, unspecified: Secondary | ICD-10-CM
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
## 2021-06-19 ENCOUNTER — Encounter: Payer: Self-pay | Admitting: Sports Medicine

## 2021-06-19 ENCOUNTER — Other Ambulatory Visit: Payer: Self-pay

## 2021-06-19 ENCOUNTER — Ambulatory Visit (INDEPENDENT_AMBULATORY_CARE_PROVIDER_SITE_OTHER): Payer: Medicaid Other | Admitting: Sports Medicine

## 2021-06-19 VITALS — BP 134/80 | HR 84 | Ht 76.0 in | Wt 241.0 lb

## 2021-06-19 DIAGNOSIS — S060X0A Concussion without loss of consciousness, initial encounter: Secondary | ICD-10-CM | POA: Diagnosis not present

## 2021-06-19 DIAGNOSIS — M545 Low back pain, unspecified: Secondary | ICD-10-CM

## 2021-06-19 NOTE — Patient Instructions (Addendum)
Good to see you Work note provided  1 week follow up   
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
## 2021-06-26 ENCOUNTER — Ambulatory Visit: Payer: Medicaid Other | Admitting: Sports Medicine

## 2021-09-02 ENCOUNTER — Emergency Department (HOSPITAL_BASED_OUTPATIENT_CLINIC_OR_DEPARTMENT_OTHER): Payer: Medicaid Other

## 2021-09-02 ENCOUNTER — Encounter (HOSPITAL_BASED_OUTPATIENT_CLINIC_OR_DEPARTMENT_OTHER): Payer: Self-pay | Admitting: Obstetrics and Gynecology

## 2021-09-02 ENCOUNTER — Emergency Department (HOSPITAL_BASED_OUTPATIENT_CLINIC_OR_DEPARTMENT_OTHER)
Admission: EM | Admit: 2021-09-02 | Discharge: 2021-09-02 | Disposition: A | Discharge status: Home / Self Care | Payer: Medicaid Other | Attending: Emergency Medicine | Admitting: Emergency Medicine

## 2021-09-02 ENCOUNTER — Other Ambulatory Visit: Payer: Self-pay

## 2021-09-02 DIAGNOSIS — Z113 Encounter for screening for infections with a predominantly sexual mode of transmission: Secondary | ICD-10-CM | POA: Diagnosis not present

## 2021-09-02 DIAGNOSIS — R1031 Right lower quadrant pain: Secondary | ICD-10-CM | POA: Insufficient documentation

## 2021-09-02 LAB — URINALYSIS, ROUTINE W REFLEX MICROSCOPIC
Bilirubin Urine: NEGATIVE
Glucose, UA: NEGATIVE mg/dL
Hgb urine dipstick: NEGATIVE
Ketones, ur: NEGATIVE mg/dL
Leukocytes,Ua: NEGATIVE
Nitrite: NEGATIVE
Specific Gravity, Urine: 1.028 (ref 1.005–1.030)
pH: 6 (ref 5.0–8.0)

## 2021-09-02 LAB — COMPREHENSIVE METABOLIC PANEL
ALT: 24 U/L (ref 0–44)
AST: 16 U/L (ref 15–41)
Albumin: 4.2 g/dL (ref 3.5–5.0)
Alkaline Phosphatase: 74 U/L (ref 38–126)
Anion gap: 10 (ref 5–15)
BUN: 8 mg/dL (ref 6–20)
CO2: 26 mmol/L (ref 22–32)
Calcium: 9.3 mg/dL (ref 8.9–10.3)
Chloride: 102 mmol/L (ref 98–111)
Creatinine, Ser: 1.04 mg/dL (ref 0.61–1.24)
GFR, Estimated: >60 mL/min (ref >60)
Glucose, Bld: 101 mg/dL — ABNORMAL HIGH (ref 70–99)
Potassium: 3.7 mmol/L (ref 3.5–5.1)
Sodium: 138 mmol/L (ref 135–145)
Total Bilirubin: 0.4 mg/dL (ref 0.3–1.2)
Total Protein: 7.2 g/dL (ref 6.5–8.1)

## 2021-09-02 LAB — LIPASE, BLOOD: Lipase: 43 U/L (ref 11–51)

## 2021-09-02 LAB — CBC
HCT: 43.8 % (ref 39.0–52.0)
Hemoglobin: 14.6 g/dL (ref 13.0–17.0)
MCH: 27.7 pg (ref 26.0–34.0)
MCHC: 33.3 g/dL (ref 30.0–36.0)
MCV: 83 fL (ref 80.0–100.0)
Platelets: 241 10*3/uL (ref 150–400)
RBC: 5.28 MIL/uL (ref 4.22–5.81)
RDW: 12.5 % (ref 11.5–15.5)
WBC: 7.5 10*3/uL (ref 4.0–10.5)
nRBC: 0 % (ref 0.0–0.2)

## 2021-09-02 LAB — RAPID HIV SCREEN (HIV 1/2 AB+AG)
HIV 1/2 Antibodies: NONREACTIVE
HIV-1 P24 Antigen - HIV24: NONREACTIVE

## 2021-09-02 MED ORDER — DOXYCYCLINE HYCLATE 100 MG PO CAPS: 100.0000 mg | ORAL_CAPSULE | Freq: Two times a day (BID) | ORAL | 0 refills | Status: AC

## 2021-09-02 MED ORDER — LACTATED RINGERS IV BOLUS
1000.0000 mL | Freq: Once | INTRAVENOUS | Status: AC
  Administered 2021-09-02: 1000 mL via INTRAVENOUS

## 2021-09-02 MED ORDER — CEFTRIAXONE SODIUM 500 MG IJ SOLR
500.0000 mg | Freq: Once | INTRAMUSCULAR | Status: AC
Start: 2021-09-02 — End: 2021-09-02
  Administered 2021-09-02: 500 mg via INTRAMUSCULAR
  Filled 2021-09-02: qty 500

## 2021-09-02 MED ORDER — IOHEXOL 300 MG/ML  SOLN
100.0000 mL | Freq: Once | INTRAMUSCULAR | Status: AC | PRN
  Administered 2021-09-02: 100 mL via INTRAVENOUS

## 2021-09-02 MED ORDER — FENTANYL CITRATE PF 50 MCG/ML IJ SOSY
50.0000 ug | PREFILLED_SYRINGE | Freq: Once | INTRAMUSCULAR | Status: DC
Start: 2021-09-02 — End: 2021-09-02

## 2021-09-02 NOTE — Discharge Instructions (Addendum)
You were evaluated in the Emergency Department and after careful evaluation, we did not find any emergent condition requiring admission or further testing in the hospital. ? ?Your exam/testing today was overall reassuring. Your labs were reassuring. Your CT did not reveal acute abnormalities: ?  ?IMPRESSION:  ?Normal appendix. No acute or inflammatory process identified in the  ?abdomen or pelvis.  ? ?We will empirically treat you for STIs given your concern. ? ?Please return to the Emergency Department if you experience any worsening of your condition.  Thank you for allowing [place] to be a part of your care. ? ?

## 2021-09-02 NOTE — ED Notes (Signed)
Patient transported to CT 

## 2021-09-02 NOTE — ED Provider Notes (Signed)
?[facility] EMER[facility] DEPT ?Provider Note ? ? ?CSN: [id number] ?Arrival date & time: 09/02/21  6295 ? ?  ? ?History ? ?Chief Complaint  ?Patient presents with  ? Abdominal Pain  ? ? ?Craig Carlson is a 21 y.o. male. ? ? ?Abdominal Pain ? ?21 year old male with history of anxiety & depression presenting to the emergency department with right lower quadrant abdominal pain and darker urine and increased frequency with urination.  Symptoms been ongoing for the past few days.  Patient endorses 6 out of 10 pain in his right lower quadrant with minimal radiation of pain.  He denies any testicular pain or swelling.  He denies any nausea or vomiting.  He notes any fevers or chills.  He states that he has been tolerating normal amount of oral intake.  He also requests STI testing. ? ?Home Medications ?Prior to Admission medications   ?Medication Sig Start Date End Date Taking? Authorizing Provider  ?doxycycline (VIBRAMYCIN) 100 MG capsule Take 1 capsule (100 mg total) by mouth 2 (two) times daily for 7 days. 09/02/21 09/09/21 Yes Ernie Avena, MD  ?dicyclomine (BENTYL) 20 MG tablet Take 1 tablet (20 mg total) by mouth 2 (two) times daily for 5 days. 04/29/21 05/04/21  Sloan Leiter, DO  ?sucralfate (CARAFATE) 1 g tablet Take 1 tablet (1 g total) by mouth 4 (four) times daily -  with meals and at bedtime for 7 days. 04/29/21 05/06/21  Sloan Leiter, DO  ?traZODone (DESYREL) 50 MG tablet Take 0.5-1 tablets (25-50 mg total) by mouth at bedtime as needed for sleep. 01/12/21   Jerrol Banana, MD  ?Vitamin D, Ergocalciferol, (DRISDOL) 1.25 MG (50000 UNIT) CAPS capsule Take 1 capsule (50,000 Units total) by mouth every 7 (seven) days. Take for 8 total doses(weeks) 01/29/21   Jerrol Banana, MD  ?   ? ?Allergies    ?Patient has no known allergies.   ? ?Review of Systems   ?Review of Systems  ?Gastrointestinal:  Positive for abdominal pain.  ?All other systems reviewed and are negative. ? ?Physical Exam ?Updated  Vital Signs ?BP 121/61   Pulse 73   Temp 98.2 ?F (36.8 ?C) (Oral)   Resp 16   Ht 6\' 4"  (1.93 m)   Wt 110.7 kg   SpO2 100%   BMI 29.70 kg/m?  ?Physical Exam ?Vitals and nursing note reviewed.  ?Constitutional:   ?   General: He is not in acute distress. ?   Appearance: He is well-developed.  ?HENT:  ?   Head: Normocephalic and atraumatic.  ?Eyes:  ?   Conjunctiva/sclera: Conjunctivae normal.  ?Cardiovascular:  ?   Rate and Rhythm: Normal rate and regular rhythm.  ?Pulmonary:  ?   Effort: Pulmonary effort is normal. No respiratory distress.  ?Abdominal:  ?   Palpations: Abdomen is soft.  ?   Tenderness: There is abdominal tenderness in the right lower quadrant. There is guarding. There is no rebound.  ?Genitourinary: ?   Comments: Pt declined GU exam ?Musculoskeletal:     ?   General: No swelling.  ?   Cervical back: Neck supple.  ?Skin: ?   General: Skin is warm and dry.  ?   Capillary Refill: Capillary refill takes less than 2 seconds.  ?Neurological:  ?   Mental Status: He is alert.  ?Psychiatric:     ?   Mood and Affect: Mood normal.  ? ? ?ED Results / Procedures / Treatments   ?Labs ?(all labs ordered  are listed, but only abnormal results are displayed) ?Labs Reviewed  ?COMPREHENSIVE METABOLIC PANEL - Abnormal; Notable for the following components:  ?    Result Value  ? Glucose, Bld 101 (*)   ? All other components within normal limits  ?URINALYSIS, ROUTINE W REFLEX MICROSCOPIC - Abnormal; Notable for the following components:  ? Protein, ur TRACE (*)   ? All other components within normal limits  ?LIPASE, BLOOD  ?CBC  ?RAPID HIV SCREEN (HIV 1/2 AB+AG)  ?RPR  ?GC/CHLAMYDIA PROBE AMP ([place]) NOT AT [facility]  ? ? ?EKG ?None ? ?Radiology ?CT ABDOMEN PELVIS W CONTRAST ? ?Result Date: 09/02/2021 ?CLINICAL DATA:  21 year old male with 2 days of right lower quadrant abdominal pain. EXAM: CT ABDOMEN AND PELVIS WITH CONTRAST TECHNIQUE: Multidetector CT imaging of the abdomen and pelvis was performed using the  standard protocol following bolus administration of intravenous contrast. RADIATION DOSE REDUCTION: This exam was performed according to the departmental dose-optimization program which includes automated exposure control, adjustment of the mA and/or kV according to patient size and/or use of iterative reconstruction technique. CONTRAST:  OMNIPAQUE IOHEXOL 300 MG/ML  SOLN COMPARISON:  Noncontrast CT Abdomen and Pelvis 06/20/2020. FINDINGS: Lower chest: Small fat containing right costophrenic angle Bochdalek's hernia (normal variant). Otherwise negative. Hepatobiliary: Negative liver and gallbladder. Pancreas: Negative. Spleen: Negative. Adrenals/Urinary Tract: Normal adrenal glands. Kidneys appears symmetric and normal. No definite nephrolithiasis. No evidence of obstruction or renal inflammation. Diminutive bladder. Stomach/Bowel: Redundant large bowel with retained gas and stool. Normal retrocecal appendix on series 2, image 65. No large bowel inflammation. Negative terminal ileum. No dilated small bowel. Stomach and duodenum appear negative. No free air, free fluid, mesenteric inflammation. Vascular/Lymphatic: Major arterial structures in the abdomen and pelvis appear patent and normal. Pelvic central venous structures and the portal venous system in the abdomen appear to be patent. No lymphadenopathy identified. Reproductive: Negative. Other: No pelvic free fluid. Multiple pelvic phleboliths. Musculoskeletal: Negative. IMPRESSION: Normal appendix. No acute or inflammatory process identified in the abdomen or pelvis. Electronically Signed   By: Odessa Fleming M.D.   On: 09/02/2021 09:53   ? ?Procedures ?Procedures  ? ? ?Medications Ordered in ED ?Medications  ?cefTRIAXone (ROCEPHIN) injection 500 mg (has no administration in time range)  ?lactated ringers bolus 1,000 mL (1,000 mLs Intravenous New Bag/Given 09/02/21 0834)  ?iohexol (OMNIPAQUE) 300 MG/ML solution 100 mL (100 mLs Intravenous Contrast Given 09/02/21 0825)   ? ? ?ED Course/ Medical Decision Making/ A&P ?  ?                        ?Medical Decision Making ?Amount and/or Complexity of Data Reviewed ?Labs: ordered. ?Radiology: ordered. ? ?Risk ?Prescription drug management. ? ? ?21 year old male with history of anxiety & depression presenting to the emergency department with right lower quadrant abdominal pain and darker urine and increased frequency with urination.  Symptoms been ongoing for the past few days.  Patient endorses 6 out of 10 pain in his right lower quadrant with minimal radiation of pain.  He denies any testicular pain or swelling.  He denies any nausea or vomiting.  He notes any fevers or chills.  He states that he has been tolerating normal amount of oral intake.  He also requests STI testing. ? ? ?Vitals and telemetry on arrival: Vitally stable, afebrile, hemodynamically stable, sinus rhythm noted on cardiac telemetry ? ?Pertinent exam findings include: Right lower quadrant tenderness to palpation with minimal guarding ? ?Differential  Diagnosis: Considered appendicitis.  Also considered bowel Obstruction, AAA, ACS, pneumonia, pneumothorax, Pyelonephritis, Nephrolithiasis, Pancreatitis, Cholecystitis, Shingles, Perforated Bowel or Ulcer, Diverticulosis/itis, Ischemic Mesentery, Inflammatory Bowel Disease, Strangulated/Incarcerated Hernia, gastritis, PUD.   ? ?Lab results include: Nonreactive, CMP unremarkable, urinalysis without evidence of UTI or hematuria, lipase normal, RPR and GC/chlamydia in process, CBC without a leukocytosis or anemia. ? ?Imaging results include: CT abdomen pelvis without acute findings in the abdomen or pelvis ? ?Course of tx has consisted of: Patient received an IV fluid bolus with subsequent improvement. ? ?Thought process: Patient request STI testing although was fairly asymptomatic at this time.  Requesting empiric coverage for STIs.  We will cover with Rocephin and doxycycline.  No rash to suggest syphilis at this time.  No  skin lesions per the patient. Declines GU exam. Low concern for orchitis or torsion at this time as patient denies any testicular symptoms. ? ?Presentation more consistent with right lower quadrant abdominal pai

## 2021-09-02 NOTE — ED Triage Notes (Signed)
Patient reports to the ER for abdominal pain that runs around to his back on the right side. Minimal guarding with palpation in the RLQ. Patient also reports darker urine and pain with urination. Some potential concern for STDs. Patient reports he has not been drinking a lot of water. Denies N/V/D.  ?

## 2021-09-03 LAB — GC/CHLAMYDIA PROBE AMP (~~LOC~~) NOT AT ARMC
Chlamydia: NEGATIVE
Comment: NEGATIVE
Comment: NORMAL
Neisseria Gonorrhea: NEGATIVE

## 2021-09-03 LAB — RPR: RPR Ser Ql: NONREACTIVE

## 2021-09-24 ENCOUNTER — Emergency Department (HOSPITAL_BASED_OUTPATIENT_CLINIC_OR_DEPARTMENT_OTHER)
Admission: EM | Admit: 2021-09-24 | Discharge: 2021-09-24 | Disposition: A | Discharge status: Home / Self Care | Payer: Medicaid Other | Attending: Emergency Medicine | Admitting: Emergency Medicine

## 2021-09-24 ENCOUNTER — Other Ambulatory Visit: Payer: Self-pay

## 2021-09-24 ENCOUNTER — Encounter (HOSPITAL_BASED_OUTPATIENT_CLINIC_OR_DEPARTMENT_OTHER): Payer: Self-pay | Admitting: Emergency Medicine

## 2021-09-24 ENCOUNTER — Emergency Department (HOSPITAL_BASED_OUTPATIENT_CLINIC_OR_DEPARTMENT_OTHER): Payer: Medicaid Other | Admitting: Radiology

## 2021-09-24 DIAGNOSIS — G8929 Other chronic pain: Secondary | ICD-10-CM | POA: Insufficient documentation

## 2021-09-24 DIAGNOSIS — M545 Low back pain, unspecified: Secondary | ICD-10-CM | POA: Insufficient documentation

## 2021-09-24 DIAGNOSIS — R0789 Other chest pain: Secondary | ICD-10-CM | POA: Insufficient documentation

## 2021-09-24 DIAGNOSIS — R079 Chest pain, unspecified: Secondary | ICD-10-CM | POA: Diagnosis present

## 2021-09-24 LAB — HEPATIC FUNCTION PANEL
ALT: 18 U/L (ref 0–44)
AST: 18 U/L (ref 15–41)
Albumin: 4.5 g/dL (ref 3.5–5.0)
Alkaline Phosphatase: 68 U/L (ref 38–126)
Bilirubin, Direct: 0.1 mg/dL (ref 0.0–0.2)
Indirect Bilirubin: 0.3 mg/dL (ref 0.3–0.9)
Total Bilirubin: 0.4 mg/dL (ref 0.3–1.2)
Total Protein: 7.5 g/dL (ref 6.5–8.1)

## 2021-09-24 LAB — BASIC METABOLIC PANEL
Anion gap: 8 (ref 5–15)
BUN: 9 mg/dL (ref 6–20)
CO2: 24 mmol/L (ref 22–32)
Calcium: 9.4 mg/dL (ref 8.9–10.3)
Chloride: 107 mmol/L (ref 98–111)
Creatinine, Ser: 1.01 mg/dL (ref 0.61–1.24)
GFR, Estimated: >60 mL/min (ref >60)
Glucose, Bld: 78 mg/dL (ref 70–99)
Potassium: 3.9 mmol/L (ref 3.5–5.1)
Sodium: 139 mmol/L (ref 135–145)

## 2021-09-24 LAB — URINALYSIS, ROUTINE W REFLEX MICROSCOPIC
Bilirubin Urine: NEGATIVE
Glucose, UA: NEGATIVE mg/dL
Hgb urine dipstick: NEGATIVE
Ketones, ur: NEGATIVE mg/dL
Leukocytes,Ua: NEGATIVE
Nitrite: NEGATIVE
Specific Gravity, Urine: 1.029 (ref 1.005–1.030)
pH: 6.5 (ref 5.0–8.0)

## 2021-09-24 LAB — CBC
HCT: 43.1 % (ref 39.0–52.0)
Hemoglobin: 14.4 g/dL (ref 13.0–17.0)
MCH: 27.6 pg (ref 26.0–34.0)
MCHC: 33.4 g/dL (ref 30.0–36.0)
MCV: 82.7 fL (ref 80.0–100.0)
Platelets: 241 10*3/uL (ref 150–400)
RBC: 5.21 MIL/uL (ref 4.22–5.81)
RDW: 12.8 % (ref 11.5–15.5)
WBC: 7.5 10*3/uL (ref 4.0–10.5)
nRBC: 0 % (ref 0.0–0.2)

## 2021-09-24 LAB — LIPASE, BLOOD: Lipase: 24 U/L (ref 11–51)

## 2021-09-24 LAB — TROPONIN I (HIGH SENSITIVITY): Troponin I (High Sensitivity): <2 ng/L (ref <18)

## 2021-09-24 MED ORDER — NAPROXEN 500 MG PO TABS: 500.0000 mg | ORAL_TABLET | Freq: Two times a day (BID) | ORAL | 0 refills | Status: DC | PRN

## 2021-09-24 NOTE — ED Triage Notes (Addendum)
Pt arrives to ED with c/o chest pains, abdominal pain, and back pain. The CP started on 4/20, chronic lower back pain that has worsened, and lower abd pain that is intermittent over the past couple of weeks. Pt also reports new onset of red bumps on the head of his penis.  ?

## 2021-09-24 NOTE — ED Notes (Signed)
Patient verbalizes understanding of discharge instructions. Opportunity for questioning and answers were provided. Patient discharged from ED.  °

## 2021-09-24 NOTE — Discharge Instructions (Addendum)
I am prescribing you an anti-inflammatory medication called naproxen.  This is similar to Aleve.  You can take this up to 2 times a day for management of your pain.  Try to take it with a small amount of food to help prevent stomach irritation. ? ?Please continue to monitor your symptoms closely and return to the emergency department with any new or worsening symptoms.  Please work to find a primary care provider in the area. ?

## 2021-09-24 NOTE — ED Provider Notes (Signed)
?[facility] EMERGENCY DEPT ?Provider Note ? ? ?CSN: [id number] ?Arrival date & time: 09/24/21  1453 ? ?  ? ?History ? ?Chief Complaint  ?Patient presents with  ? Chest Pain  ? Back Pain  ? ? ?Craig Carlson is a 21 y.o. male. ? ?HPI ?Patient is a 21 year old male who presents to the emergency department with multiple complaints.  Patient states that he has been having intermittent left-sided sharp chest pain for the past 4 days.  Occurs about 2-3 times per day.  He is unsure of how long it lasts.  Reports intermittent diaphoresis when it occurs but states that this does not always occur with the chest pain.  No shortness of breath, nausea, vomiting.  Worsens and alleviates based on positioning.  States it also sometimes worsens with heavy lifting.  Denies unilateral leg swelling, hemoptysis, recent surgery/trauma, history of blood clot, hormone use. ? ?Patient also complains of chronic low back pain.  States it has been ongoing for "quite a while".  No new falls or trauma.  No bowel/bladder incontinence. ? ?Patient reports intermittent lower abdominal pain.  States it is sharp in nature.  No modifying factors.  None currently.  No nausea, vomiting, diarrhea, dysuria. ? ?Lastly, patient complains of irritation to the glans penis that he noticed last night.  States he started a new body wash.  Denies any penile discharge.  States he is in a monogamous relationship with 1 male partner. ?  ? ?Home Medications ?Prior to Admission medications   ?Medication Sig Start Date End Date Taking? Authorizing Provider  ?naproxen (NAPROSYN) 500 MG tablet Take 1 tablet (500 mg total) by mouth 2 (two) times daily as needed. 09/24/21  Yes Placido Sou, PA-C  ?dicyclomine (BENTYL) 20 MG tablet Take 1 tablet (20 mg total) by mouth 2 (two) times daily for 5 days. 04/29/21 05/04/21  Sloan Leiter, DO  ?sucralfate (CARAFATE) 1 g tablet Take 1 tablet (1 g total) by mouth 4 (four) times daily -  with meals and at  bedtime for 7 days. 04/29/21 05/06/21  Sloan Leiter, DO  ?traZODone (DESYREL) 50 MG tablet Take 0.5-1 tablets (25-50 mg total) by mouth at bedtime as needed for sleep. 01/12/21   Jerrol Banana, MD  ?Vitamin D, Ergocalciferol, (DRISDOL) 1.25 MG (50000 UNIT) CAPS capsule Take 1 capsule (50,000 Units total) by mouth every 7 (seven) days. Take for 8 total doses(weeks) 01/29/21   Jerrol Banana, MD  ?   ?Allergies    ?Patient has no known allergies.   ? ?Review of Systems   ?Review of Systems  ?All other systems reviewed and are negative. ?Ten systems reviewed and are negative for acute change, except as noted in the HPI.   ?Physical Exam ?Updated Vital Signs ?BP 122/69 (BP Location: Right Arm)   Pulse 78   Temp 98.6 ?F (37 ?C) (Oral)   Resp 15   Ht 6\' 4"  (1.93 m)   Wt 111.1 kg   SpO2 99%   BMI 29.82 kg/m?  ?Physical Exam ?Vitals and nursing note reviewed.  ?Constitutional:   ?   General: He is not in acute distress. ?   Appearance: Normal appearance. He is well-developed and normal weight. He is not ill-appearing, toxic-appearing or diaphoretic.  ?HENT:  ?   Head: Normocephalic and atraumatic.  ?   Right Ear: External ear normal.  ?   Left Ear: External ear normal.  ?   Nose: Nose normal.  ?   Mouth/Throat:  ?  Mouth: Mucous membranes are moist.  ?   Pharynx: Oropharynx is clear. No oropharyngeal exudate or posterior oropharyngeal erythema.  ?Eyes:  ?   Extraocular Movements: Extraocular movements intact.  ?Cardiovascular:  ?   Rate and Rhythm: Normal rate and regular rhythm.  ?   Pulses: Normal pulses.  ?   Heart sounds: Normal heart sounds. Heart sounds not distant. No murmur heard. ?No systolic murmur is present.  ?No diastolic murmur is present.  ?  No friction rub. No gallop. No S3 or S4 sounds.  ?Pulmonary:  ?   Effort: Pulmonary effort is normal. No tachypnea, accessory muscle usage or respiratory distress.  ?   Breath sounds: Normal breath sounds. No stridor. No decreased breath sounds, wheezing,  rhonchi or rales.  ?Chest:  ?   Chest wall: Tenderness present.  ?   Comments: Moderate TTP noted diffusely along the left anterior chest.  No left shoulder tenderness. ?Abdominal:  ?   General: Abdomen is flat.  ?   Palpations: Abdomen is soft.  ?   Tenderness: There is no abdominal tenderness.  ?   Comments: Abdomen is flat, soft, and nontender in all 4 quadrants.  ?Genitourinary: ?   Comments: Nursing chaperone present.  Normal-appearing uncircumcised penis.  No rashes or lesions.  No tenderness appreciated along the penile shaft, scrotal region, testicles, epididymides.  No hernias. ?Musculoskeletal:     ?   General: Normal range of motion.  ?   Cervical back: Normal range of motion and neck supple. No tenderness.  ?   Right lower leg: No tenderness. No edema.  ?   Left lower leg: No tenderness. No edema.  ?   Comments: No pedal edema.  No calf tenderness.  No tenderness appreciated along the midline spine.  Mild tenderness appreciated along the bilateral thoracic paraspinal musculature.  ?Skin: ?   General: Skin is warm and dry.  ?Neurological:  ?   General: No focal deficit present.  ?   Mental Status: He is alert and oriented to person, place, and time.  ?Psychiatric:     ?   Mood and Affect: Mood normal.     ?   Behavior: Behavior normal.  ? ?ED Results / Procedures / Treatments   ?Labs ?(all labs ordered are listed, but only abnormal results are displayed) ?Labs Reviewed  ?URINALYSIS, ROUTINE W REFLEX MICROSCOPIC - Abnormal; Notable for the following components:  ?    Result Value  ? Protein, ur TRACE (*)   ? All other components within normal limits  ?BASIC METABOLIC PANEL  ?CBC  ?HEPATIC FUNCTION PANEL  ?LIPASE, BLOOD  ?TROPONIN I (HIGH SENSITIVITY)  ? ?EKG ?None ? ?Radiology ?DG Chest 2 View ? ?Result Date: 09/24/2021 ?CLINICAL DATA:  chest pain EXAM: CHEST - 2 VIEW COMPARISON:  Chest x-ray 05/23/2021. FINDINGS: No consolidation. No visible pleural effusions or pneumothorax. Cardiomediastinal silhouette  is within normal limits. No displaced fracture. IMPRESSION: No active cardiopulmonary disease. Electronically Signed   By: Feliberto Harts M.D.   On: 09/24/2021 15:24   ? ?Procedures ?Procedures  ? ?Medications Ordered in ED ?Medications - No data to display ? ?ED Course/ Medical Decision Making/ A&P ?  ?                        ?Medical Decision Making ?Amount and/or Complexity of Data Reviewed ?Labs: ordered. ?Radiology: ordered. ? ?Risk ?Prescription drug management. ? ?Pt is a 21 y.o. male who presents to the  emergency department due to intermittent chest pain, chronic low back pain, intermittent abdominal pain, as well as irritation to the penis. ? ?Labs: ?CBC without abnormalities. ?BMP without abnormalities. ?Hepatic function panel without abnormalities. ?Lipase within normal limits at 24. ?Troponin less than 2. ?UA with trace protein. ? ?Imaging: ?Chest x-ray shows "no active cardiopulmonary disease." ? ?I, Placido SouLogan Vivika Poythress, PA-C, personally reviewed and evaluated these images and lab results as part of my medical decision-making. ? ?Patient's physical exam appears generally reassuring.  He has moderate tenderness to the left anterior chest wall.  Heart is regular rate and rhythm without murmurs, rubs, or gallops.  Patient afebrile and nontoxic-appearing.  Lungs are clear to auscultation bilaterally.  ECG does not appear ischemic.  Chest x-ray is negative.  Troponin less than 2.  Doubt ACS at this time.  Patient is PERC negative.  Doubt DVT/PE. ? ?Additionally has mild tenderness to the bilateral thoracic paraspinal musculature.  No midline spine pain.  Will discharge patient on a course of naproxen for his back and chest wall pain.  We discussed dosing as well as safety regarding this medication. ? ?Lastly, patient's GU exam appears normal.  He did note some red irritation to the glans penis last night but also notes that he recently started a new body wash.  Urged him to discontinue this body wash and  return to the previously used body wash that was not causing the symptoms. ? ?Patient appears stable for discharge at this time and he is agreeable.  Discussed return precautions.  Recommended PCP follow-up.  His ques

## 2021-09-30 ENCOUNTER — Emergency Department (HOSPITAL_BASED_OUTPATIENT_CLINIC_OR_DEPARTMENT_OTHER)
Admission: EM | Admit: 2021-09-30 | Discharge: 2021-09-30 | Disposition: A | Discharge status: Home / Self Care | Payer: Medicaid Other | Attending: Emergency Medicine | Admitting: Emergency Medicine

## 2021-09-30 ENCOUNTER — Other Ambulatory Visit: Payer: Self-pay

## 2021-09-30 ENCOUNTER — Encounter (HOSPITAL_BASED_OUTPATIENT_CLINIC_OR_DEPARTMENT_OTHER): Payer: Self-pay | Admitting: Emergency Medicine

## 2021-09-30 ENCOUNTER — Telehealth: Payer: Medicaid Other | Admitting: Family

## 2021-09-30 DIAGNOSIS — N481 Balanitis: Secondary | ICD-10-CM | POA: Insufficient documentation

## 2021-09-30 DIAGNOSIS — R1030 Lower abdominal pain, unspecified: Secondary | ICD-10-CM | POA: Diagnosis present

## 2021-09-30 DIAGNOSIS — N489 Disorder of penis, unspecified: Secondary | ICD-10-CM

## 2021-09-30 LAB — URINALYSIS, ROUTINE W REFLEX MICROSCOPIC
Bilirubin Urine: NEGATIVE
Glucose, UA: NEGATIVE mg/dL
Hgb urine dipstick: NEGATIVE
Ketones, ur: NEGATIVE mg/dL
Leukocytes,Ua: NEGATIVE
Nitrite: NEGATIVE
Protein, ur: NEGATIVE mg/dL
Specific Gravity, Urine: 1.023 (ref 1.005–1.030)
pH: 7 (ref 5.0–8.0)

## 2021-09-30 NOTE — ED Triage Notes (Signed)
Recurrent Lower abd pain , eruption on the tip of the penis , denies discharge , no dysuria.  ?

## 2021-09-30 NOTE — Progress Notes (Signed)
Based on what you shared with me, I feel your condition warrants further evaluation and I recommend that you be seen in a face to face visit. ?  ?Given your symptoms, your lesion needs to be looked at in person.  ? ? ?NOTE: There will be NO CHARGE for this eVisit ?  ?If you are having a true medical emergency please call 911.   ?  ? For an urgent face to face visit, [facility]son has six urgent care centers for your convenience:  ?  ? [facility] at [facility] ?Get Driving Directions ?[phone] ?[id number] ?[place], [place] [address] ?  ? [facility]) ?Get Driving Directions ?[phone] ?[address] ?[place], [place] [address] ? ?[facility]) ?Get Driving Directions ?[phone] ?[address] ?[place],  [place]  [address] ? ?[facility] ?Get Driving Directions ?[phone] ?[address], [address] ?[place], [place] [address] ?  ?[facility] [place] ?Get Driving Directions  ?[phone] ?[address] ?[address] ?[place], [place] [address] ?  ?[facility] ?Get Driving Directions ?[phone] ?[address] ?[place], [place] [address] ? ?Your MyChart E-visit questionnaire answers were reviewed by a board certified advanced clinical practitioner to complete your personal care plan based on your specific symptoms.  Thank you for using e-Visits. ?  ? ?

## 2021-09-30 NOTE — ED Notes (Signed)
Discharge paperwork given and understood. 

## 2021-09-30 NOTE — ED Provider Notes (Signed)
?[facility] EM[facility] DEPT ?Provider Note ? ? ?CSN: [id number] ?Arrival date & time: 09/30/21  1750 ? ?  ? ?History ? ?Chief Complaint  ?Patient presents with  ? Abdominal Pain  ? ? ?Charlee Whitebread Fayson is a 21 y.o. male. ? ?HPI ?Patient presents for concern of spots on his penis.  He states he is currently in a monogamous relationship with his partner who is currently pregnant.  He denies any recent high risk sexual encounters.  He has not had any testicular pain, dysuria, or penile discharge.  He noticed some very small pink spots on his glans yesterday and today.  The spots were not painful or pruritic.  Additionally, earlier today he did experience some lower abdominal pain.  He states that he has been having normal bowel movements.  Currently his abdominal pain is resolved.  He has not had any nausea. ? ?Home Medications ?Prior to Admission medications   ?Medication Sig Start Date End Date Taking? Authorizing Provider  ?dicyclomine (BENTYL) 20 MG tablet Take 1 tablet (20 mg total) by mouth 2 (two) times daily for 5 days. 04/29/21 05/04/21  Sloan Leiter, DO  ?naproxen (NAPROSYN) 500 MG tablet Take 1 tablet (500 mg total) by mouth 2 (two) times daily as needed. 09/24/21   Placido Sou, PA-C  ?sucralfate (CARAFATE) 1 g tablet Take 1 tablet (1 g total) by mouth 4 (four) times daily -  with meals and at bedtime for 7 days. 04/29/21 05/06/21  Sloan Leiter, DO  ?traZODone (DESYREL) 50 MG tablet Take 0.5-1 tablets (25-50 mg total) by mouth at bedtime as needed for sleep. 01/12/21   Jerrol Banana, MD  ?Vitamin D, Ergocalciferol, (DRISDOL) 1.25 MG (50000 UNIT) CAPS capsule Take 1 capsule (50,000 Units total) by mouth every 7 (seven) days. Take for 8 total doses(weeks) 01/29/21   Jerrol Banana, MD  ?   ? ?Allergies    ?Patient has no known allergies.   ? ?Review of Systems   ?Review of Systems  ?Gastrointestinal:  Positive for abdominal pain (Resolved).  ?Genitourinary:   ?     Pink spots on  glans  ?All other systems reviewed and are negative. ? ?Physical Exam ?Updated Vital Signs ?BP (!) 128/44 (BP Location: Right Arm)   Pulse 75   Temp 98.5 ?F (36.9 ?C)   Resp 16   SpO2 100%  ?Physical Exam ?Vitals and nursing note reviewed. Exam conducted with a chaperone present.  ?Constitutional:   ?   General: He is not in acute distress. ?   Appearance: He is well-developed and normal weight. He is not ill-appearing, toxic-appearing or diaphoretic.  ?HENT:  ?   Head: Normocephalic and atraumatic.  ?   Mouth/Throat:  ?   Mouth: Mucous membranes are moist.  ?   Pharynx: Oropharynx is clear.  ?Eyes:  ?   Conjunctiva/sclera: Conjunctivae normal.  ?   Pupils: Pupils are equal, round, and reactive to light.  ?Cardiovascular:  ?   Rate and Rhythm: Normal rate and regular rhythm.  ?   Heart sounds: No murmur heard. ?Pulmonary:  ?   Effort: Pulmonary effort is normal. No respiratory distress.  ?Abdominal:  ?   Palpations: Abdomen is soft.  ?   Tenderness: There is no abdominal tenderness.  ?Genitourinary: ?   Pubic Area: No rash.   ?   Penis: Normal and uncircumcised. No erythema, tenderness, discharge or lesions.   ?   Testes: Normal.     ?  Right: Tenderness or swelling not present.     ?   Left: Tenderness or swelling not present.  ?   Epididymis:  ?   Right: Normal. No tenderness.  ?   Left: Normal. No tenderness.  ?Musculoskeletal:     ?   General: No swelling.  ?   Cervical back: Neck supple.  ?Skin: ?   General: Skin is warm and dry.  ?   Capillary Refill: Capillary refill takes less than 2 seconds.  ?Neurological:  ?   Mental Status: He is alert.  ?Psychiatric:     ?   Mood and Affect: Mood normal.  ? ? ?ED Results / Procedures / Treatments   ?Labs ?(all labs ordered are listed, but only abnormal results are displayed) ?Labs Reviewed  ?URINALYSIS, ROUTINE W REFLEX MICROSCOPIC  ?GC/CHLAMYDIA PROBE AMP ([place]) NOT AT [facility]  ? ? ?EKG ?None ? ?Radiology ?No results found. ? ?Procedures ?Procedures   ? ? ?Medications Ordered in ED ?Medications - No data to display ? ?ED Course/ Medical Decision Making/ A&P ?  ?                        ?Medical Decision Making ?Amount and/or Complexity of Data Reviewed ?Labs: ordered. ? ? ?Patient is a healthy 21 year old male who presents for concern of spots on his glans penis.  He additionally had some lower abdominal pain earlier in the day which has resolved.  On arrival in the ED, vital signs are normal and patient is well-appearing.  His abdomen is soft and he denies any tenderness, even to deep palpation.  GU exam was performed with nurse chaperone present.  Patient is a uncircumcised male.  On retraction of foreskin, patient's glans is normal in appearance.  There is some very faint, tiny pink spots on his glans that are not tender.  There is no other evidence of penile lesions.  Testicles are normal in appearance without tenderness.  Presentation is consistent with some very mild balanitis.  Patient was advised to perform good hygiene and keep the area under his foreskin clean and dry.  He did request urinalysis and GC/chlamydia testing, which was ordered.  Urinalysis is reassuring and patient was advised to follow-up on results of GC/chlamydia through MyChart.  He was given reassurance and discharged in good condition. ? ? ? ? ? ? ? ?Final Clinical Impression(s) / ED Diagnoses ?Final diagnoses:  ?Balanitis  ? ? ?Rx / DC Orders ?ED Discharge Orders   ? ? None  ? ?  ? ? ?  ?Gloris Manchester, MD ?10/02/21 1225 ? ?

## 2021-09-30 NOTE — Discharge Instructions (Signed)
Maintain good hygiene.  Ensure that you retract your foreskin to clean and dry underneath.  The results of your gonorrhea and Chlamydia testing will result in the next 1 to 2 days.  Check your MyChart for these results.  Return to the emergency department for any further concerning symptoms. ?

## 2021-10-01 ENCOUNTER — Other Ambulatory Visit: Payer: Self-pay

## 2021-10-01 ENCOUNTER — Emergency Department (HOSPITAL_COMMUNITY)
Admission: EM | Admit: 2021-10-01 | Discharge: 2021-10-01 | Disposition: A | Discharge status: Home / Self Care | Payer: Medicaid Other | Attending: Emergency Medicine | Admitting: Emergency Medicine

## 2021-10-01 ENCOUNTER — Encounter (HOSPITAL_COMMUNITY): Payer: Self-pay

## 2021-10-01 DIAGNOSIS — S060X1A Concussion with loss of consciousness of 30 minutes or less, initial encounter: Secondary | ICD-10-CM | POA: Insufficient documentation

## 2021-10-01 DIAGNOSIS — S069X9A Unspecified intracranial injury with loss of consciousness of unspecified duration, initial encounter: Secondary | ICD-10-CM | POA: Diagnosis present

## 2021-10-01 DIAGNOSIS — S0990XA Unspecified injury of head, initial encounter: Secondary | ICD-10-CM

## 2021-10-01 DIAGNOSIS — S0181XA Laceration without foreign body of other part of head, initial encounter: Secondary | ICD-10-CM | POA: Diagnosis not present

## 2021-10-01 NOTE — ED Notes (Signed)
Pt ambulatory without assistance.  
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
## 2021-10-01 NOTE — ED Notes (Signed)
I provided reinforced discharge education based off of discharge instructions. Pt acknowledged and understood my education. Pt had no further questions/concerns for provider/myself.  °
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
## 2021-10-01 NOTE — Discharge Instructions (Addendum)
Given the mechanism of your injury, and loss of consciousness and reported seizure we discussed doing some blood work, and imaging of the head however you refused to have this done tonight.  You likely do have a concussion.  I have attached information sheet discussing what to expect with a concussion versus what is concerning and warrants return to the emergency room.  You requested that your laceration be repaired with glue.  You stated you have a return if the glue does not hold out, or you have any worsening symptoms.  Given that she had a seizure we discussed seizure precautions which have attached below.  Also attached neurological follow-up for you above. ? ?-Patient counseled not to drive or operate heavy machinery until at least 6 months seizure/event free, per [facility]; patient is aware doing so could result in harm to self or others and legal sanctions  ?-Patient also counseled not to partake in any activities alone or unsupervised which could pose a danger to self or others should they have a seizure while participating in these activities (e.g. swimming, bathing, climbing ladders, cooking with open flame or hot liquids). ? ?

## 2021-10-02 ENCOUNTER — Emergency Department (HOSPITAL_COMMUNITY)
Admission: EM | Admit: 2021-10-02 | Discharge: 2021-10-03 | Discharge status: Against Medical Advice | Payer: Medicaid Other | Attending: Emergency Medicine | Admitting: Emergency Medicine

## 2021-10-02 ENCOUNTER — Other Ambulatory Visit: Payer: Self-pay

## 2021-10-02 ENCOUNTER — Encounter (HOSPITAL_COMMUNITY): Payer: Self-pay | Admitting: Emergency Medicine

## 2021-10-02 DIAGNOSIS — W228XXA Striking against or struck by other objects, initial encounter: Secondary | ICD-10-CM | POA: Diagnosis not present

## 2021-10-02 DIAGNOSIS — S0990XA Unspecified injury of head, initial encounter: Secondary | ICD-10-CM | POA: Insufficient documentation

## 2021-10-02 DIAGNOSIS — Z5321 Procedure and treatment not carried out due to patient leaving prior to being seen by health care provider: Secondary | ICD-10-CM | POA: Diagnosis not present

## 2021-10-02 LAB — GC/CHLAMYDIA PROBE AMP (~~LOC~~) NOT AT ARMC
Chlamydia: NEGATIVE
Comment: NEGATIVE
Comment: NORMAL
Neisseria Gonorrhea: NEGATIVE
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
## 2021-10-03 NOTE — ED Notes (Signed)
Patient called several times by ct staff for scan with no response and not visible in the lobby ?

## 2021-10-22 ENCOUNTER — Emergency Department (HOSPITAL_BASED_OUTPATIENT_CLINIC_OR_DEPARTMENT_OTHER): Payer: Medicaid Other | Admitting: Radiology

## 2021-10-22 ENCOUNTER — Encounter (HOSPITAL_BASED_OUTPATIENT_CLINIC_OR_DEPARTMENT_OTHER): Payer: Self-pay

## 2021-10-22 ENCOUNTER — Other Ambulatory Visit: Payer: Self-pay

## 2021-10-22 ENCOUNTER — Emergency Department (HOSPITAL_BASED_OUTPATIENT_CLINIC_OR_DEPARTMENT_OTHER)
Admission: EM | Admit: 2021-10-22 | Discharge: 2021-10-22 | Disposition: A | Discharge status: Home / Self Care | Payer: Medicaid Other | Attending: Emergency Medicine | Admitting: Emergency Medicine

## 2021-10-22 DIAGNOSIS — R1032 Left lower quadrant pain: Secondary | ICD-10-CM | POA: Diagnosis not present

## 2021-10-22 DIAGNOSIS — R1030 Lower abdominal pain, unspecified: Secondary | ICD-10-CM

## 2021-10-22 DIAGNOSIS — R1031 Right lower quadrant pain: Secondary | ICD-10-CM | POA: Insufficient documentation

## 2021-10-22 LAB — CBC WITH DIFFERENTIAL/PLATELET
Abs Immature Granulocytes: 0.01 10*3/uL (ref 0.00–0.07)
Basophils Absolute: 0 10*3/uL (ref 0.0–0.1)
Basophils Relative: 0 %
Eosinophils Absolute: 0.3 10*3/uL (ref 0.0–0.5)
Eosinophils Relative: 3 %
HCT: 45.8 % (ref 39.0–52.0)
Hemoglobin: 15.3 g/dL (ref 13.0–17.0)
Immature Granulocytes: 0 %
Lymphocytes Relative: 44 %
Lymphs Abs: 3.4 10*3/uL (ref 0.7–4.0)
MCH: 27.7 pg (ref 26.0–34.0)
MCHC: 33.4 g/dL (ref 30.0–36.0)
MCV: 83 fL (ref 80.0–100.0)
Monocytes Absolute: 0.8 10*3/uL (ref 0.1–1.0)
Monocytes Relative: 10 %
Neutro Abs: 3.3 10*3/uL (ref 1.7–7.7)
Neutrophils Relative %: 43 %
Platelets: 256 10*3/uL (ref 150–400)
RBC: 5.52 MIL/uL (ref 4.22–5.81)
RDW: 12.5 % (ref 11.5–15.5)
WBC: 7.8 10*3/uL (ref 4.0–10.5)
nRBC: 0 % (ref 0.0–0.2)

## 2021-10-22 LAB — URINALYSIS, ROUTINE W REFLEX MICROSCOPIC
Bilirubin Urine: NEGATIVE
Glucose, UA: NEGATIVE mg/dL
Hgb urine dipstick: NEGATIVE
Ketones, ur: NEGATIVE mg/dL
Leukocytes,Ua: NEGATIVE
Nitrite: NEGATIVE
Specific Gravity, Urine: 1.031 — ABNORMAL HIGH (ref 1.005–1.030)
pH: 6.5 (ref 5.0–8.0)

## 2021-10-22 LAB — COMPREHENSIVE METABOLIC PANEL
ALT: 18 U/L (ref 0–44)
AST: 15 U/L (ref 15–41)
Albumin: 4.3 g/dL (ref 3.5–5.0)
Alkaline Phosphatase: 68 U/L (ref 38–126)
Anion gap: 7 (ref 5–15)
BUN: 11 mg/dL (ref 6–20)
CO2: 28 mmol/L (ref 22–32)
Calcium: 9.3 mg/dL (ref 8.9–10.3)
Chloride: 104 mmol/L (ref 98–111)
Creatinine, Ser: 1.02 mg/dL (ref 0.61–1.24)
GFR, Estimated: >60 mL/min (ref >60)
Glucose, Bld: 131 mg/dL — ABNORMAL HIGH (ref 70–99)
Potassium: 3.6 mmol/L (ref 3.5–5.1)
Sodium: 139 mmol/L (ref 135–145)
Total Bilirubin: 0.5 mg/dL (ref 0.3–1.2)
Total Protein: 7.1 g/dL (ref 6.5–8.1)

## 2021-10-22 LAB — LIPASE, BLOOD: Lipase: 25 U/L (ref 11–51)

## 2021-10-22 MED ORDER — HYDROCODONE-ACETAMINOPHEN 5-325 MG PO TABS
1.0000 | ORAL_TABLET | Freq: Once | ORAL | Status: AC
  Administered 2021-10-22: 1 via ORAL
  Filled 2021-10-22: qty 1

## 2021-10-22 NOTE — ED Provider Notes (Signed)
[facility]   CSN: [id number] Arrival date & time: 10/22/21  0156     History  Chief Complaint  Patient presents with   Abdominal Pain    Craig SereneKenneth Alvin Carlson is a 21 y.o. male.  HPI     This is a 21 year old male who presents with lower abdominal pain.  Patient reports intermittent bilateral lower quadrant abdominal pain that comes and goes.  He states that it is sometimes sharp in nature but also crampy.  He has not had any nausea, vomiting, diarrhea, change in bowel movements.  Denies urinary symptoms or fevers.  States that it may be related to food.  Denies any concerns for STD's.  Chart was reviewed.  He was seen and evaluated in November with similar complaints and had a negative work-up.  Patient was also seen and evaluated and had a negative CT abdomen on 09/02/2021.  Patient denies any GU or testicular symptoms.  Denies any masses.  Home Medications Prior to Admission medications   Medication Sig Start Date End Date Taking? Authorizing Provider  dicyclomine (BENTYL) 20 MG tablet Take 1 tablet (20 mg total) by mouth 2 (two) times daily for 5 days. 04/29/21 05/04/21  Tanda RockersGray, Samuel A, DO  naproxen (NAPROSYN) 500 MG tablet Take 1 tablet (500 mg total) by mouth 2 (two) times daily as needed. 09/24/21   Placido SouJoldersma, Logan, PA-C  sucralfate (CARAFATE) 1 g tablet Take 1 tablet (1 g total) by mouth 4 (four) times daily -  with meals and at bedtime for 7 days. 04/29/21 05/06/21  Sloan LeiterGray, Samuel A, DO  traZODone (DESYREL) 50 MG tablet Take 0.5-1 tablets (25-50 mg total) by mouth at bedtime as needed for sleep. 01/12/21   Jerrol BananaMatthews, Jason J, MD  Vitamin D, Ergocalciferol, (DRISDOL) 1.25 MG (50000 UNIT) CAPS capsule Take 1 capsule (50,000 Units total) by mouth every 7 (seven) days. Take for 8 total doses(weeks) 01/29/21   Jerrol BananaMatthews, Jason J, MD      Allergies    Patient has no known allergies.    Review of Systems   Review of Systems  Constitutional:   Negative for fever.  Gastrointestinal:  Positive for abdominal pain.  All other systems reviewed and are negative.  Physical Exam Updated Vital Signs BP 132/71   Pulse 72   Temp 98.1 F (36.7 C) (Oral)   Resp 16   Ht 1.93 m (6\' 4" )   Wt 109.3 kg   SpO2 100%   BMI 29.34 kg/m  Physical Exam Vitals and nursing note reviewed.  Constitutional:      Appearance: He is well-developed. He is not ill-appearing.  HENT:     Head: Normocephalic and atraumatic.  Eyes:     Pupils: Pupils are equal, round, and reactive to light.  Cardiovascular:     Rate and Rhythm: Normal rate and regular rhythm.     Heart sounds: Normal heart sounds. No murmur heard. Pulmonary:     Effort: Pulmonary effort is normal. No respiratory distress.     Breath sounds: Normal breath sounds. No wheezing.  Abdominal:     General: Bowel sounds are normal.     Palpations: Abdomen is soft.     Tenderness: There is no abdominal tenderness. There is no guarding or rebound.  Musculoskeletal:     Cervical back: Neck supple.  Lymphadenopathy:     Cervical: No cervical adenopathy.  Skin:    General: Skin is warm and dry.  Neurological:     Mental  Status: He is alert and oriented to person, place, and time.    ED Results / Procedures / Treatments   Labs (all labs ordered are listed, but only abnormal results are displayed) Labs Reviewed  COMPREHENSIVE METABOLIC PANEL - Abnormal; Notable for the following components:      Result Value   Glucose, Bld 131 (*)    All other components within normal limits  URINALYSIS, ROUTINE W REFLEX MICROSCOPIC - Abnormal; Notable for the following components:   Specific Gravity, Urine 1.031 (*)    Protein, ur TRACE (*)    All other components within normal limits  CBC WITH DIFFERENTIAL/PLATELET  LIPASE, BLOOD    EKG None  Radiology DG Abdomen 1 View  Result Date: 10/22/2021 CLINICAL DATA:  Constipation. EXAM: ABDOMEN - 1 VIEW COMPARISON:  Abdominal CT dated 09/02/2021.  FINDINGS: Moderate stool throughout the colon. No bowel dilatation or evidence of obstruction. No free air or radiopaque calculi. The osseous structures are intact the soft tissues are unremarkable. IMPRESSION: Negative. Electronically Signed   By: Elgie Collard M.D.   On: 10/22/2021 03:04    Procedures Procedures    Medications Ordered in ED Medications  HYDROcodone-acetaminophen (NORCO/VICODIN) 5-325 MG per tablet 1 tablet (1 tablet Oral Given 10/22/21 0239)    ED Course/ Medical Decision Making/ A&P                           Medical Decision Making Amount and/or Complexity of Data Reviewed Labs: ordered. Radiology: ordered.  Risk Prescription drug management.   This patient presents to the ED for concern of abdominal pain, this involves an extensive number of treatment options, and is a complaint that carries with it a high risk of complications and morbidity.  I considered the following differential and admission for this acute, potentially life threatening condition.  The differential diagnosis includes constipation, UTI, appendicitis, colitis, cholecystitis  MDM:    This is a 21 year old male who presents with recurrent lower abdominal pain.  He is nontoxic and vital signs are reassuring.  His abdominal exam is fairly benign.  I have reviewed his chart.  Patient has had several visits in the past with similar complaints and negative work-up.  Labs obtained in addition to KUB.  No obvious constipation on KUB.  Labs reviewed and show no evidence metabolic derangements.  Normal left FTs and lipase.  This is very reassuring.  Patient was just seen and evaluated and diagnosed with balanitis.  An STD testing at that time.  Do not feel this needs to be repeated.  Recommend he continue Bentyl and Carafate as prescribed previously.  (Labs, imaging, consults)  Labs: I Ordered, and personally interpreted labs.  The pertinent results include: CBC, CMP, lipase  Imaging Studies  ordered: I ordered imaging studies including KUB without significant constipation I independently visualized and interpreted imaging. I agree with the radiologist interpretation  Additional history obtained from chart review.  External records from outside source obtained and reviewed including evaluations for abdominal pain  Cardiac Monitoring: The patient was maintained on a cardiac monitor.  I personally viewed and interpreted the cardiac monitored which showed an underlying rhythm of: Normal sinus rhythm  Reevaluation: After the interventions noted above, I reevaluated the patient and found that they have :stayed the same  Social Determinants of Health: Lives independently  Disposition: Discharge  Co morbidities that complicate the patient evaluation  Past Medical History:  Diagnosis Date   Anxiety  Depression    Seizures (HCC)      Medicines Meds ordered this encounter  Medications   HYDROcodone-acetaminophen (NORCO/VICODIN) 5-325 MG per tablet 1 tablet    I have reviewed the patients home medicines and have made adjustments as needed  Problem List / ED Course: Problem List Items Addressed This Visit   None Visit Diagnoses     Lower abdominal pain    -  Primary                   Final Clinical Impression(s) / ED Diagnoses Final diagnoses:  Lower abdominal pain    Rx / DC Orders ED Discharge Orders     None         Shon Baton, MD 10/22/21 [phone]

## 2021-10-22 NOTE — Discharge Instructions (Signed)
You were seen today for lower abdominal pain.  Your labs and x-ray imaging are unremarkable.  Monitor your symptoms closely.  Take Tylenol as needed.

## 2021-10-22 NOTE — ED Triage Notes (Signed)
Pt reports that he was here 2 weeks ago for abdominal pain with negative workup, here today for same right & left lower abdominal discomfort. Denies N/V/D or any other symptoms.

## 2021-10-22 NOTE — ED Notes (Signed)
Pt verbalizes understanding of discharge instructions. Opportunity for questioning and answers were provided. Pt discharged from ED to home.   ? ?

## 2021-11-13 ENCOUNTER — Emergency Department (HOSPITAL_BASED_OUTPATIENT_CLINIC_OR_DEPARTMENT_OTHER)
Admission: EM | Admit: 2021-11-13 | Discharge: 2021-11-13 | Disposition: A | Discharge status: Home / Self Care | Payer: Medicaid Other | Attending: Emergency Medicine | Admitting: Emergency Medicine

## 2021-11-13 ENCOUNTER — Other Ambulatory Visit: Payer: Self-pay

## 2021-11-13 ENCOUNTER — Encounter (HOSPITAL_BASED_OUTPATIENT_CLINIC_OR_DEPARTMENT_OTHER): Payer: Self-pay

## 2021-11-13 ENCOUNTER — Other Ambulatory Visit (HOSPITAL_BASED_OUTPATIENT_CLINIC_OR_DEPARTMENT_OTHER): Payer: Self-pay

## 2021-11-13 DIAGNOSIS — Z113 Encounter for screening for infections with a predominantly sexual mode of transmission: Secondary | ICD-10-CM | POA: Insufficient documentation

## 2021-11-13 DIAGNOSIS — R1030 Lower abdominal pain, unspecified: Secondary | ICD-10-CM | POA: Insufficient documentation

## 2021-11-13 LAB — CBC
HCT: 45.3 % (ref 39.0–52.0)
Hemoglobin: 15.1 g/dL (ref 13.0–17.0)
MCH: 28 pg (ref 26.0–34.0)
MCHC: 33.3 g/dL (ref 30.0–36.0)
MCV: 83.9 fL (ref 80.0–100.0)
Platelets: 273 10*3/uL (ref 150–400)
RBC: 5.4 MIL/uL (ref 4.22–5.81)
RDW: 12.5 % (ref 11.5–15.5)
WBC: 6.3 10*3/uL (ref 4.0–10.5)
nRBC: 0 % (ref 0.0–0.2)

## 2021-11-13 LAB — COMPREHENSIVE METABOLIC PANEL
ALT: 15 U/L (ref 0–44)
AST: 15 U/L (ref 15–41)
Albumin: 4.3 g/dL (ref 3.5–5.0)
Alkaline Phosphatase: 79 U/L (ref 38–126)
Anion gap: 14 (ref 5–15)
BUN: 12 mg/dL (ref 6–20)
CO2: 22 mmol/L (ref 22–32)
Calcium: 9.7 mg/dL (ref 8.9–10.3)
Chloride: 105 mmol/L (ref 98–111)
Creatinine, Ser: 1.12 mg/dL (ref 0.61–1.24)
GFR, Estimated: >60 mL/min (ref >60)
Glucose, Bld: 142 mg/dL — ABNORMAL HIGH (ref 70–99)
Potassium: 3.9 mmol/L (ref 3.5–5.1)
Sodium: 141 mmol/L (ref 135–145)
Total Bilirubin: 0.5 mg/dL (ref 0.3–1.2)
Total Protein: 7.3 g/dL (ref 6.5–8.1)

## 2021-11-13 LAB — URINALYSIS, ROUTINE W REFLEX MICROSCOPIC
Bilirubin Urine: NEGATIVE
Glucose, UA: NEGATIVE mg/dL
Hgb urine dipstick: NEGATIVE
Ketones, ur: NEGATIVE mg/dL
Leukocytes,Ua: NEGATIVE
Nitrite: NEGATIVE
Protein, ur: NEGATIVE mg/dL
Specific Gravity, Urine: 1.027 (ref 1.005–1.030)
pH: 5.5 (ref 5.0–8.0)

## 2021-11-13 LAB — LIPASE, BLOOD: Lipase: 26 U/L (ref 11–51)

## 2021-11-13 LAB — HIV ANTIBODY (ROUTINE TESTING W REFLEX): HIV Screen 4th Generation wRfx: NONREACTIVE

## 2021-11-13 MED ORDER — LIDOCAINE HCL (PF) 1 % IJ SOLN
1.0000 mL | Freq: Once | INTRAMUSCULAR | Status: AC
  Administered 2021-11-13: 2.1 mL
  Filled 2021-11-13: qty 5

## 2021-11-13 MED ORDER — DOXYCYCLINE HYCLATE 100 MG PO TABS
100.0000 mg | ORAL_TABLET | Freq: Once | ORAL | Status: AC
  Administered 2021-11-13: 100 mg via ORAL
  Filled 2021-11-13: qty 1

## 2021-11-13 MED ORDER — DOXYCYCLINE HYCLATE 100 MG PO CAPS
100.0000 mg | ORAL_CAPSULE | Freq: Two times a day (BID) | ORAL | 0 refills | Status: DC
  Filled 2021-11-13: qty 20, 10d supply, fill #0

## 2021-11-13 MED ORDER — CEFTRIAXONE SODIUM 500 MG IJ SOLR
500.0000 mg | Freq: Once | INTRAMUSCULAR | Status: AC
  Administered 2021-11-13: 500 mg via INTRAMUSCULAR
  Filled 2021-11-13: qty 500

## 2021-11-13 NOTE — ED Notes (Signed)
Pt given urinal to provide a sample. 

## 2021-11-13 NOTE — Discharge Instructions (Addendum)
Today you were tested for gonorrhea, chlamydia, syphilis, and HIV.  These tests take 1 to 2 days, please follow your MyChart account for results.  Today you requested to be prophylactically treated for gonorrhea and chlamydia.  You were given a shot of Rocephin for gonorrhea and your first dose of doxycycline for chlamydia.  A 10-day supply of doxycycline was sent to your pharmacy in case you are positive for chlamydia.  Please only take these medications if your results are positive.  Please follow closely with your primary care physician or the wellness clinic provided if results are positive for test of cure after antibiotics are completed.
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
## 2021-11-13 NOTE — ED Triage Notes (Signed)
Pt states his pain is more around pelvic area, states he has been peeing more lately. State his pain is 5/10. States pain is located in middle of pelvic. Denies any N/V.

## 2021-11-14 LAB — RPR: RPR Ser Ql: NONREACTIVE

## 2021-11-14 LAB — GC/CHLAMYDIA PROBE AMP (~~LOC~~) NOT AT ARMC
Chlamydia: NEGATIVE
Comment: NEGATIVE
Comment: NORMAL
Neisseria Gonorrhea: NEGATIVE

## 2021-11-21 ENCOUNTER — Ambulatory Visit: Payer: Medicaid Other | Admitting: Physician Assistant

## 2021-11-29 ENCOUNTER — Ambulatory Visit: Payer: Medicaid Other | Admitting: Physician Assistant

## 2021-12-08 ENCOUNTER — Emergency Department (HOSPITAL_COMMUNITY)
Admission: EM | Admit: 2021-12-08 | Discharge: 2021-12-08 | Discharge status: Against Medical Advice | Payer: Medicaid Other | Attending: Emergency Medicine | Admitting: Emergency Medicine

## 2021-12-08 ENCOUNTER — Telehealth: Payer: Medicaid Other | Admitting: Nurse Practitioner

## 2021-12-08 ENCOUNTER — Other Ambulatory Visit: Payer: Self-pay

## 2021-12-08 DIAGNOSIS — R300 Dysuria: Secondary | ICD-10-CM | POA: Diagnosis not present

## 2021-12-08 DIAGNOSIS — R202 Paresthesia of skin: Secondary | ICD-10-CM | POA: Diagnosis not present

## 2021-12-08 DIAGNOSIS — R1030 Lower abdominal pain, unspecified: Secondary | ICD-10-CM | POA: Insufficient documentation

## 2021-12-08 DIAGNOSIS — R8290 Unspecified abnormal findings in urine: Secondary | ICD-10-CM

## 2021-12-08 DIAGNOSIS — R519 Headache, unspecified: Secondary | ICD-10-CM | POA: Insufficient documentation

## 2021-12-08 DIAGNOSIS — Z5321 Procedure and treatment not carried out due to patient leaving prior to being seen by health care provider: Secondary | ICD-10-CM | POA: Diagnosis not present

## 2021-12-08 LAB — CBC WITH DIFFERENTIAL/PLATELET
Abs Immature Granulocytes: 0.03 10*3/uL (ref 0.00–0.07)
Basophils Absolute: 0 10*3/uL (ref 0.0–0.1)
Basophils Relative: 0 %
Eosinophils Absolute: 0.3 10*3/uL (ref 0.0–0.5)
Eosinophils Relative: 3 %
HCT: 47.6 % (ref 39.0–52.0)
Hemoglobin: 15.6 g/dL (ref 13.0–17.0)
Immature Granulocytes: 0 %
Lymphocytes Relative: 43 %
Lymphs Abs: 3.8 10*3/uL (ref 0.7–4.0)
MCH: 28.4 pg (ref 26.0–34.0)
MCHC: 32.8 g/dL (ref 30.0–36.0)
MCV: 86.5 fL (ref 80.0–100.0)
Monocytes Absolute: 0.7 10*3/uL (ref 0.1–1.0)
Monocytes Relative: 8 %
Neutro Abs: 4.2 10*3/uL (ref 1.7–7.7)
Neutrophils Relative %: 46 %
Platelets: 262 10*3/uL (ref 150–400)
RBC: 5.5 MIL/uL (ref 4.22–5.81)
RDW: 12.4 % (ref 11.5–15.5)
WBC: 9 10*3/uL (ref 4.0–10.5)
nRBC: 0 % (ref 0.0–0.2)

## 2021-12-08 LAB — COMPREHENSIVE METABOLIC PANEL
ALT: 27 U/L (ref 0–44)
AST: 25 U/L (ref 15–41)
Albumin: 3.9 g/dL (ref 3.5–5.0)
Alkaline Phosphatase: 89 U/L (ref 38–126)
Anion gap: 10 (ref 5–15)
BUN: 6 mg/dL (ref 6–20)
CO2: 24 mmol/L (ref 22–32)
Calcium: 8.8 mg/dL — ABNORMAL LOW (ref 8.9–10.3)
Chloride: 106 mmol/L (ref 98–111)
Creatinine, Ser: 1.14 mg/dL (ref 0.61–1.24)
GFR, Estimated: >60 mL/min (ref >60)
Glucose, Bld: 114 mg/dL — ABNORMAL HIGH (ref 70–99)
Potassium: 3.8 mmol/L (ref 3.5–5.1)
Sodium: 140 mmol/L (ref 135–145)
Total Bilirubin: 0.5 mg/dL (ref 0.3–1.2)
Total Protein: 7.2 g/dL (ref 6.5–8.1)

## 2021-12-08 LAB — URINALYSIS, ROUTINE W REFLEX MICROSCOPIC
Bilirubin Urine: NEGATIVE
Glucose, UA: NEGATIVE mg/dL
Hgb urine dipstick: NEGATIVE
Ketones, ur: NEGATIVE mg/dL
Leukocytes,Ua: NEGATIVE
Nitrite: NEGATIVE
Protein, ur: NEGATIVE mg/dL
Specific Gravity, Urine: 1.016 (ref 1.005–1.030)
pH: 6 (ref 5.0–8.0)

## 2021-12-08 LAB — LIPASE, BLOOD: Lipase: 34 U/L (ref 11–51)

## 2021-12-08 NOTE — ED Triage Notes (Signed)
Pt arrives with multiple complaints: headache x 3 days, lower abdominal pain, foul smelling urine, and tingling to the tip of his penis.
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
## 2021-12-08 NOTE — ED Provider Triage Note (Signed)
Emergency Medicine Provider Triage Evaluation Note  Craig Carlson , a 21 y.o. male  was evaluated in triage.  Pt complains of headache.  History of headaches, right-sided, unilateral, throbbing, x3 days, did not take any medications prior to arrival.  States that OTC meds do not usually work well for him.  No nausea, no vomiting, no other neurodeficits, no light sensitivity or sound sensitivity Also complains of dysuria tingling and burning with urination with increased frequency and sharp lower abdominal pain.  Review of Systems  Positive: Headache Negative: vomiting  Physical Exam  BP (!) 148/84 (BP Location: Right Arm)   Pulse 92   Temp 98.4 F (36.9 C) (Oral)   Resp 14   SpO2 95%  Gen:   Awake, no distress   Resp:  Normal effort  MSK:   Moves extremities without difficulty  Other:  No neurodeficits, no tenderness to palpation of the abdomen  Medical Decision Making  Medically screening exam initiated at 4:25 PM.  Appropriate orders placed.  Craig Carlson was informed that the remainder of the evaluation will be completed by another provider, this initial triage assessment does not replace that evaluation, and the importance of remaining in the ED until their evaluation is complete.  Work-up initiated   Arthor Captain, PA-C 12/08/21 1628

## 2021-12-08 NOTE — ED Notes (Signed)
PT called for room X3 no response

## 2021-12-08 NOTE — ED Notes (Signed)
PT called for vitals X3 no response. Will attempt again shortly

## 2021-12-10 LAB — GC/CHLAMYDIA PROBE AMP (~~LOC~~) NOT AT ARMC
Chlamydia: NEGATIVE
Comment: NEGATIVE
Comment: NORMAL
Neisseria Gonorrhea: NEGATIVE

## 2021-12-18 ENCOUNTER — Other Ambulatory Visit: Payer: Self-pay

## 2021-12-18 ENCOUNTER — Encounter (HOSPITAL_BASED_OUTPATIENT_CLINIC_OR_DEPARTMENT_OTHER): Payer: Self-pay

## 2021-12-18 DIAGNOSIS — G43909 Migraine, unspecified, not intractable, without status migrainosus: Secondary | ICD-10-CM | POA: Insufficient documentation

## 2021-12-18 DIAGNOSIS — Z5321 Procedure and treatment not carried out due to patient leaving prior to being seen by health care provider: Secondary | ICD-10-CM | POA: Insufficient documentation

## 2021-12-18 NOTE — ED Triage Notes (Signed)
Patient here POV from Home.  Endorses Migraine that Began this AM. No Head Injury.  History of Inconsistent Migraines. No Fevers. Mild Nausea. 1 Episode of Emesis. No Diarrhea. Mild Photosensitivity.   NAD Noted during Triage. A&Ox4. GCS 15. Ambulatory.

## 2021-12-19 ENCOUNTER — Emergency Department (HOSPITAL_BASED_OUTPATIENT_CLINIC_OR_DEPARTMENT_OTHER)
Admission: EM | Admit: 2021-12-19 | Discharge: 2021-12-19 | Discharge status: Against Medical Advice | Payer: Medicaid Other | Attending: Emergency Medicine | Admitting: Emergency Medicine

## 2021-12-30 ENCOUNTER — Other Ambulatory Visit: Payer: Self-pay

## 2021-12-30 ENCOUNTER — Emergency Department (HOSPITAL_BASED_OUTPATIENT_CLINIC_OR_DEPARTMENT_OTHER)
Admission: EM | Admit: 2021-12-30 | Discharge: 2021-12-30 | Disposition: A | Discharge status: Home / Self Care | Payer: Medicaid Other | Attending: Emergency Medicine | Admitting: Emergency Medicine

## 2021-12-30 DIAGNOSIS — B9689 Other specified bacterial agents as the cause of diseases classified elsewhere: Secondary | ICD-10-CM | POA: Diagnosis not present

## 2021-12-30 DIAGNOSIS — H1033 Unspecified acute conjunctivitis, bilateral: Secondary | ICD-10-CM | POA: Insufficient documentation

## 2021-12-30 DIAGNOSIS — H579 Unspecified disorder of eye and adnexa: Secondary | ICD-10-CM | POA: Diagnosis present

## 2021-12-30 MED ORDER — ERYTHROMYCIN 5 MG/GM OP OINT: TOPICAL_OINTMENT | OPHTHALMIC | 0 refills | Status: DC

## 2021-12-30 NOTE — Discharge Instructions (Signed)
Your history, exam, evaluation today are consistent with suspected bacterial conjunctivitis causing the eye redness, drainage, and discharge.  As we discussed, it also could be viral or allergic however given some of the discharge it is reasonable to treat with antibiotics.  Please use the antibiotics and follow-up with your primary doctor.  Given your description of symptoms we agreed together to hold on further eye evaluation today.  if any symptoms change or worsen acutely, please return to the nearest emergency department.

## 2021-12-30 NOTE — ED Triage Notes (Signed)
Patient arrives ambulatory to triage with complaints of worsening eye redness x3 days with occasional drainage.

## 2021-12-30 NOTE — ED Provider Notes (Signed)
[facility] EMERGENCY DEPT Provider Note   CSN: [id number] Arrival date & time: 12/30/21  1129     History  Chief Complaint  Patient presents with   Conjunctivitis   Eye Drainage    Craig Carlson is a 21 y.o. male.  The history is provided by the patient and medical records. No language interpreter was used.  Conjunctivitis This is a new problem. The current episode started 2 days ago. The problem occurs constantly. The problem has been gradually worsening. Pertinent negatives include no chest pain, no abdominal pain, no headaches and no shortness of breath. Nothing aggravates the symptoms. Nothing relieves the symptoms. He has tried nothing for the symptoms. The treatment provided no relief.       Home Medications Prior to Admission medications   Medication Sig Start Date End Date Taking? Authorizing Provider  dicyclomine (BENTYL) 20 MG tablet Take 1 tablet (20 mg total) by mouth 2 (two) times daily for 5 days. 04/29/21 05/04/21  Sloan Leiter, DO  doxycycline (VIBRAMYCIN) 100 MG capsule Take 1 capsule (100 mg total) by mouth 2 (two) times daily. 11/13/21   Franne Forts, DO  lamoTRIgine (LAMICTAL) 25 MG tablet Take 25 mg by mouth daily. 09/28/21   [provider]  naproxen (NAPROSYN) 500 MG tablet Take 1 tablet (500 mg total) by mouth 2 (two) times daily as needed. 09/24/21   Placido Sou, PA-C  sucralfate (CARAFATE) 1 g tablet Take 1 tablet (1 g total) by mouth 4 (four) times daily -  with meals and at bedtime for 7 days. 04/29/21 05/06/21  Sloan Leiter, DO  traZODone (DESYREL) 50 MG tablet Take 0.5-1 tablets (25-50 mg total) by mouth at bedtime as needed for sleep. 01/12/21   Jerrol Banana, MD  Vitamin D, Ergocalciferol, (DRISDOL) 1.25 MG (50000 UNIT) CAPS capsule Take 1 capsule (50,000 Units total) by mouth every 7 (seven) days. Take for 8 total doses(weeks) 01/29/21   Jerrol Banana, MD      Allergies    Patient has no known  allergies.    Review of Systems   Review of Systems  Constitutional:  Negative for chills, diaphoresis, fatigue and fever.  HENT:  Positive for congestion.   Eyes:  Positive for discharge and redness. Negative for pain and visual disturbance.  Respiratory:  Negative for cough, chest tightness, shortness of breath and wheezing.   Cardiovascular:  Negative for chest pain, palpitations and leg swelling.  Gastrointestinal:  Negative for abdominal pain, constipation, diarrhea, nausea and vomiting.  Genitourinary:  Negative for flank pain.  Musculoskeletal:  Negative for back pain, neck pain and neck stiffness.  Skin:  Negative for rash and wound.  Neurological:  Negative for light-headedness, numbness and headaches.  Psychiatric/Behavioral:  Negative for agitation and confusion.   All other systems reviewed and are negative.   Physical Exam Updated Vital Signs BP (!) 130/97 (BP Location: Right Arm)   Pulse 96   Temp 98.2 F (36.8 C)   Resp 16   Ht 6\' 4"  (1.93 m)   Wt 115.7 kg   SpO2 98%   BMI 31.04 kg/m  Physical Exam Vitals and nursing note reviewed.  Constitutional:      General: He is not in acute distress.    Appearance: He is well-developed. He is not ill-appearing, toxic-appearing or diaphoretic.  HENT:     Nose: Congestion present.     Mouth/Throat:     Mouth: Mucous membranes are moist.  Eyes:  General: No scleral icterus.       Right eye: Discharge present.        Left eye: No discharge.     Extraocular Movements: Extraocular movements intact.     Pupils: Pupils are equal, round, and reactive to light.     Comments: Injected conjunctiva bilaterally with some discharge in the right eye.  No periorbital tenderness or erythema.  No swelling.  Normal pupil exam with otherwise normal extraocular movements.  No vision changes reported.  Cardiovascular:     Rate and Rhythm: Normal rate and regular rhythm.     Heart sounds: No murmur heard. Pulmonary:     Effort:  Pulmonary effort is normal. No respiratory distress.     Breath sounds: Normal breath sounds.  Abdominal:     Palpations: Abdomen is soft.     Tenderness: There is no abdominal tenderness.  Musculoskeletal:        General: No swelling or tenderness.     Cervical back: Neck supple.  Skin:    General: Skin is warm and dry.     Capillary Refill: Capillary refill takes less than 2 seconds.  Neurological:     Mental Status: He is alert.  Psychiatric:        Mood and Affect: Mood normal.     ED Results / Procedures / Treatments   Labs (all labs ordered are listed, but only abnormal results are displayed) Labs Reviewed - No data to display  EKG None  Radiology No results found.  Procedures Procedures    Medications Ordered in ED Medications - No data to display  ED Course/ Medical Decision Making/ A&P                           Medical Decision Making Risk Prescription drug management.    Craig Carlson is a 21 y.o. male with a past medical history significant for anxiety, depression, and seizures who presents with eye redness and discharge.  Patient says that for the last few days he has had some eye redness initially on the right eye then moved to both eyes.  There is some discharge that in the right eye that he is complained of.  He reports some congestion but no ear pains.  No fevers or chills.  No cough.  No other complaints.  No vision changes and no eye pains.  No history of glaucoma or other traumatic injury to the eye.  On exam, lungs clear and chest nontender.  Neck nontender.  Ears show evidence of otitis media but patient did have some drainage in his nose.  Patient does have injected conjunctiva but has some discharge in the.  Pupil exam otherwise unremarkable.  We had discussion that this may be viral such as adenovirus or something like an allergy however given the discharge in the eye we feel is reasonable to give prescription for Romycin to treat.   Patient agrees and will take the antibiotics.  He will follow-up with PCP and understood return precautions.  We have low suspicion for other concerning findings such as corneal abrasion, glaucoma, or other finding and he did not want fluorescein eye exam or further work-up at this time.  Patient agrees with plan of care and return precautions and was discharged in good condition.         Final Clinical Impression(s) / ED Diagnoses Final diagnoses:  Acute bacterial conjunctivitis of both eyes    Rx /  DC Orders ED Discharge Orders          Ordered    erythromycin ophthalmic ointment        12/30/21 1435           Clinical Impression: 1. Acute bacterial conjunctivitis of both eyes     Disposition: Discharge  Condition: Good  I have discussed the results, Dx and Tx plan with the pt(& family if present). He/she/they expressed understanding and agree(s) with the plan. Discharge instructions discussed at great length. Strict return precautions discussed and pt &/or family have verbalized understanding of the instructions. No further questions at time of discharge.    New Prescriptions   ERYTHROMYCIN OPHTHALMIC OINTMENT    Place a 1/2 inch ribbon of ointment into the lower eyelid up to 6 times a day for one week.    Follow Up: [facility] [address] [place] [address] [place] [address] [phone] Schedule an appointment as soon as possible for a visit    [facility] [address] [place] [address] [phone]        Shivansh Hardaway, Canary Brim, MD 12/30/21 [phone]

## 2022-01-28 ENCOUNTER — Encounter: Payer: Medicaid Other | Admitting: Family Medicine

## 2022-02-09 ENCOUNTER — Telehealth: Payer: Medicaid Other | Admitting: Nurse Practitioner

## 2022-02-09 DIAGNOSIS — R8290 Unspecified abnormal findings in urine: Secondary | ICD-10-CM

## 2022-02-09 NOTE — Progress Notes (Signed)
Based on what you shared with me it looks like you have urinary issues,that should be evaluated in a face to face office visit. UTI are uncommon in med your age. You need a urinalysis to determine need for treatment. This could be an STD and needs  to be evaluated.  NOTE: There will be NO CHARGE for this eVisit   If you are having a true medical emergency please call 911.      For an urgent face to face visit, [facility] has six urgent care centers for your convenience:     [facility] at [facility] Directions [phone] [address] [place], [place] [address]    [facility] Center [facility]) Get Driving Directions [phone] [address] [place], [place] [address]  [facility]) Get Driving Directions [phone] [address] [place],  [place]  [address]  [facility] Health Urgent Care at [facility] [place] [facility] Get Driving Directions [phone] [place], [place] [address]   [facility] Health Urgent Care at [facility] Get Driving Directions  [phone] [address] [place], [place] [address]   [facility] Health Urgent Care at [facility] Directions [phone] [address], [address] [place], [place] [address]  Your MyChart E-visit questionnaire answers were reviewed by a board certified advanced clinical practitioner to complete your personal care plan based on your specific symptoms.  Thank you for using e-Visits.

## 2022-03-10 ENCOUNTER — Emergency Department (HOSPITAL_BASED_OUTPATIENT_CLINIC_OR_DEPARTMENT_OTHER): Payer: Medicaid Other

## 2022-03-10 ENCOUNTER — Inpatient Hospital Stay (HOSPITAL_BASED_OUTPATIENT_CLINIC_OR_DEPARTMENT_OTHER)
Admission: EM | Admit: 2022-03-10 | Discharge: 2022-03-12 | DRG: 100 | Discharge status: Against Medical Advice | Payer: Medicaid Other | Attending: Internal Medicine | Admitting: Internal Medicine

## 2022-03-10 ENCOUNTER — Inpatient Hospital Stay (HOSPITAL_COMMUNITY): Payer: Medicaid Other

## 2022-03-10 ENCOUNTER — Other Ambulatory Visit: Payer: Self-pay

## 2022-03-10 DIAGNOSIS — Z8249 Family history of ischemic heart disease and other diseases of the circulatory system: Secondary | ICD-10-CM

## 2022-03-10 DIAGNOSIS — Z833 Family history of diabetes mellitus: Secondary | ICD-10-CM | POA: Diagnosis not present

## 2022-03-10 DIAGNOSIS — M542 Cervicalgia: Secondary | ICD-10-CM | POA: Diagnosis present

## 2022-03-10 DIAGNOSIS — G40901 Epilepsy, unspecified, not intractable, with status epilepticus: Principal | ICD-10-CM

## 2022-03-10 DIAGNOSIS — F32A Depression, unspecified: Secondary | ICD-10-CM | POA: Diagnosis present

## 2022-03-10 DIAGNOSIS — G9340 Encephalopathy, unspecified: Secondary | ICD-10-CM | POA: Diagnosis present

## 2022-03-10 DIAGNOSIS — Z20822 Contact with and (suspected) exposure to covid-19: Secondary | ICD-10-CM | POA: Diagnosis present

## 2022-03-10 DIAGNOSIS — M549 Dorsalgia, unspecified: Secondary | ICD-10-CM | POA: Diagnosis present

## 2022-03-10 DIAGNOSIS — T426X6A Underdosing of other antiepileptic and sedative-hypnotic drugs, initial encounter: Secondary | ICD-10-CM | POA: Diagnosis present

## 2022-03-10 DIAGNOSIS — R509 Fever, unspecified: Secondary | ICD-10-CM | POA: Diagnosis present

## 2022-03-10 DIAGNOSIS — G43909 Migraine, unspecified, not intractable, without status migrainosus: Secondary | ICD-10-CM | POA: Diagnosis present

## 2022-03-10 DIAGNOSIS — Z87438 Personal history of other diseases of male genital organs: Secondary | ICD-10-CM | POA: Diagnosis not present

## 2022-03-10 DIAGNOSIS — R350 Frequency of micturition: Secondary | ICD-10-CM | POA: Diagnosis present

## 2022-03-10 DIAGNOSIS — R7303 Prediabetes: Secondary | ICD-10-CM | POA: Diagnosis present

## 2022-03-10 DIAGNOSIS — Z91199 Patient's noncompliance with other medical treatment and regimen due to unspecified reason: Secondary | ICD-10-CM

## 2022-03-10 DIAGNOSIS — F419 Anxiety disorder, unspecified: Secondary | ICD-10-CM | POA: Diagnosis present

## 2022-03-10 DIAGNOSIS — D751 Secondary polycythemia: Secondary | ICD-10-CM | POA: Diagnosis present

## 2022-03-10 DIAGNOSIS — R5381 Other malaise: Secondary | ICD-10-CM | POA: Diagnosis present

## 2022-03-10 DIAGNOSIS — Z5329 Procedure and treatment not carried out because of patient's decision for other reasons: Secondary | ICD-10-CM | POA: Diagnosis present

## 2022-03-10 DIAGNOSIS — R569 Unspecified convulsions: Secondary | ICD-10-CM | POA: Diagnosis not present

## 2022-03-10 DIAGNOSIS — Z809 Family history of malignant neoplasm, unspecified: Secondary | ICD-10-CM | POA: Diagnosis not present

## 2022-03-10 DIAGNOSIS — J9600 Acute respiratory failure, unspecified whether with hypoxia or hypercapnia: Secondary | ICD-10-CM | POA: Diagnosis present

## 2022-03-10 LAB — POCT I-STAT 7, (LYTES, BLD GAS, ICA,H+H)
Acid-Base Excess: 0 mmol/L (ref 0.0–2.0)
Bicarbonate: 26.2 mmol/L (ref 20.0–28.0)
Calcium, Ion: 1.18 mmol/L (ref 1.15–1.40)
HCT: 45 % (ref 39.0–52.0)
Hemoglobin: 15.3 g/dL (ref 13.0–17.0)
O2 Saturation: 100 %
Patient temperature: 37.3
Potassium: 3.9 mmol/L (ref 3.5–5.1)
Sodium: 138 mmol/L (ref 135–145)
TCO2: 28 mmol/L (ref 22–32)
pCO2 arterial: 47 mmHg (ref 32–48)
pH, Arterial: 7.355 (ref 7.35–7.45)
pO2, Arterial: 251 mmHg — ABNORMAL HIGH (ref 83–108)

## 2022-03-10 LAB — URINALYSIS, ROUTINE W REFLEX MICROSCOPIC
Bilirubin Urine: NEGATIVE
Glucose, UA: NEGATIVE mg/dL
Hgb urine dipstick: NEGATIVE
Ketones, ur: NEGATIVE mg/dL
Leukocytes,Ua: NEGATIVE
Nitrite: NEGATIVE
Specific Gravity, Urine: 1.021 (ref 1.005–1.030)
pH: 8 (ref 5.0–8.0)

## 2022-03-10 LAB — CBC WITH DIFFERENTIAL/PLATELET
Abs Immature Granulocytes: 0.02 10*3/uL (ref 0.00–0.07)
Basophils Absolute: 0 10*3/uL (ref 0.0–0.1)
Basophils Relative: 1 %
Eosinophils Absolute: 0.3 10*3/uL (ref 0.0–0.5)
Eosinophils Relative: 5 %
HCT: 48.9 % (ref 39.0–52.0)
Hemoglobin: 16.4 g/dL (ref 13.0–17.0)
Immature Granulocytes: 0 %
Lymphocytes Relative: 37 %
Lymphs Abs: 2.6 10*3/uL (ref 0.7–4.0)
MCH: 27.9 pg (ref 26.0–34.0)
MCHC: 33.5 g/dL (ref 30.0–36.0)
MCV: 83.3 fL (ref 80.0–100.0)
Monocytes Absolute: 1.1 10*3/uL — ABNORMAL HIGH (ref 0.1–1.0)
Monocytes Relative: 15 %
Neutro Abs: 3 10*3/uL (ref 1.7–7.7)
Neutrophils Relative %: 42 %
Platelets: 203 10*3/uL (ref 150–400)
RBC: 5.87 MIL/uL — ABNORMAL HIGH (ref 4.22–5.81)
RDW: 12.8 % (ref 11.5–15.5)
WBC: 7 10*3/uL (ref 4.0–10.5)
nRBC: 0 % (ref 0.0–0.2)

## 2022-03-10 LAB — RESP PANEL BY RT-PCR (FLU A&B, COVID) ARPGX2
Influenza A by PCR: NEGATIVE
Influenza B by PCR: NEGATIVE
SARS Coronavirus 2 by RT PCR: NEGATIVE

## 2022-03-10 LAB — COMPREHENSIVE METABOLIC PANEL
ALT: 44 U/L (ref 0–44)
AST: 37 U/L (ref 15–41)
Albumin: 4.3 g/dL (ref 3.5–5.0)
Alkaline Phosphatase: 79 U/L (ref 38–126)
Anion gap: 11 (ref 5–15)
BUN: 8 mg/dL (ref 6–20)
CO2: 26 mmol/L (ref 22–32)
Calcium: 9.2 mg/dL (ref 8.9–10.3)
Chloride: 102 mmol/L (ref 98–111)
Creatinine, Ser: 1.2 mg/dL (ref 0.61–1.24)
GFR, Estimated: >60 mL/min (ref >60)
Glucose, Bld: 82 mg/dL (ref 70–99)
Potassium: 4.1 mmol/L (ref 3.5–5.1)
Sodium: 139 mmol/L (ref 135–145)
Total Bilirubin: 0.6 mg/dL (ref 0.3–1.2)
Total Protein: 7.6 g/dL (ref 6.5–8.1)

## 2022-03-10 LAB — CREATININE, SERUM
Creatinine, Ser: 1.18 mg/dL (ref 0.61–1.24)
GFR, Estimated: >60 mL/min (ref >60)

## 2022-03-10 LAB — CBC
HCT: 45.3 % (ref 39.0–52.0)
Hemoglobin: 15.3 g/dL (ref 13.0–17.0)
MCH: 28.3 pg (ref 26.0–34.0)
MCHC: 33.8 g/dL (ref 30.0–36.0)
MCV: 83.9 fL (ref 80.0–100.0)
Platelets: 146 10*3/uL — ABNORMAL LOW (ref 150–400)
RBC: 5.4 MIL/uL (ref 4.22–5.81)
RDW: 12.7 % (ref 11.5–15.5)
WBC: 7 10*3/uL (ref 4.0–10.5)
nRBC: 0 % (ref 0.0–0.2)

## 2022-03-10 LAB — GROUP A STREP BY PCR: Group A Strep by PCR: NOT DETECTED

## 2022-03-10 LAB — LACTIC ACID, PLASMA
Lactic Acid, Venous: 1.7 mmol/L (ref 0.5–1.9)
Lactic Acid, Venous: 1.3 mmol/L (ref 0.5–1.9)

## 2022-03-10 LAB — GLUCOSE, CAPILLARY: Glucose-Capillary: 159 mg/dL — ABNORMAL HIGH (ref 70–99)

## 2022-03-10 MED ORDER — DEXTROSE 5 % IV SOLN
950.0000 mg | Freq: Three times a day (TID) | INTRAVENOUS | Status: DC
  Administered 2022-03-11 (×2): 950 mg via INTRAVENOUS
  Filled 2022-03-10 (×5): qty 19

## 2022-03-10 MED ORDER — ETOMIDATE 2 MG/ML IV SOLN
20.0000 mg | Freq: Once | INTRAVENOUS | Status: AC
  Administered 2022-03-10: 20 mg via INTRAVENOUS

## 2022-03-10 MED ORDER — POLYETHYLENE GLYCOL 3350 17 G PO PACK
17.0000 g | PACK | Freq: Every day | ORAL | Status: DC
  Administered 2022-03-11: 17 g
  Filled 2022-03-10: qty 1

## 2022-03-10 MED ORDER — FENTANYL 2500MCG IN NS 250ML (10MCG/ML) PREMIX INFUSION
50.0000 ug/h | INTRAVENOUS | Status: DC
  Administered 2022-03-10: 50 ug/h via INTRAVENOUS
  Filled 2022-03-10: qty 250

## 2022-03-10 MED ORDER — DEXAMETHASONE SODIUM PHOSPHATE 10 MG/ML IJ SOLN
10.0000 mg | Freq: Once | INTRAMUSCULAR | Status: AC
  Administered 2022-03-10: 10 mg via INTRAVENOUS
  Filled 2022-03-10: qty 1

## 2022-03-10 MED ORDER — SODIUM CHLORIDE 0.9 % IV SOLN: 1500.0000 mg | Freq: Once | INTRAVENOUS | Status: DC

## 2022-03-10 MED ORDER — ACETAMINOPHEN 500 MG PO TABS
1000.0000 mg | ORAL_TABLET | Freq: Once | ORAL | Status: AC
  Administered 2022-03-10: 1000 mg via ORAL
  Filled 2022-03-10: qty 2

## 2022-03-10 MED ORDER — DEXTROSE 5 % IV SOLN
100.0000 mg | Freq: Once | INTRAVENOUS | Status: DC
  Filled 2022-03-10: qty 2

## 2022-03-10 MED ORDER — VANCOMYCIN HCL IN DEXTROSE 1-5 GM/200ML-% IV SOLN
1000.0000 mg | Freq: Three times a day (TID) | INTRAVENOUS | Status: DC
  Administered 2022-03-11: 1000 mg via INTRAVENOUS
  Filled 2022-03-10 (×3): qty 200

## 2022-03-10 MED ORDER — VANCOMYCIN HCL IN DEXTROSE 1-5 GM/200ML-% IV SOLN
1000.0000 mg | Freq: Once | INTRAVENOUS | Status: AC
  Administered 2022-03-10: 1000 mg via INTRAVENOUS
  Filled 2022-03-10 (×2): qty 200

## 2022-03-10 MED ORDER — SUCCINYLCHOLINE CHLORIDE 200 MG/10ML IV SOSY
PREFILLED_SYRINGE | INTRAVENOUS | Status: AC
  Filled 2022-03-10: qty 10

## 2022-03-10 MED ORDER — LEVETIRACETAM IN NACL 1000 MG/100ML IV SOLN
1000.0000 mg | Freq: Once | INTRAVENOUS | Status: DC
  Filled 2022-03-10: qty 100

## 2022-03-10 MED ORDER — ROCURONIUM BROMIDE 50 MG/5ML IV SOLN
100.0000 mg | Freq: Once | INTRAVENOUS | Status: DC
  Filled 2022-03-10: qty 10

## 2022-03-10 MED ORDER — SODIUM CHLORIDE 0.9 % IV SOLN
2.0000 g | Freq: Two times a day (BID) | INTRAVENOUS | Status: DC
  Administered 2022-03-11: 2 g via INTRAVENOUS
  Filled 2022-03-10: qty 20

## 2022-03-10 MED ORDER — MIDAZOLAM HCL 2 MG/2ML IJ SOLN
INTRAMUSCULAR | Status: AC
  Filled 2022-03-10: qty 2

## 2022-03-10 MED ORDER — ROCURONIUM BROMIDE 10 MG/ML (PF) SYRINGE
PREFILLED_SYRINGE | INTRAVENOUS | Status: AC
  Administered 2022-03-10: 10 mL
  Filled 2022-03-10: qty 10

## 2022-03-10 MED ORDER — PROPOFOL 1000 MG/100ML IV EMUL
5.0000 ug/kg/min | INTRAVENOUS | Status: DC
  Administered 2022-03-10: 40 ug/kg/min via INTRAVENOUS
  Filled 2022-03-10: qty 100

## 2022-03-10 MED ORDER — ORAL CARE MOUTH RINSE
15.0000 mL | OROMUCOSAL | Status: DC
  Administered 2022-03-10 – 2022-03-11 (×9): 15 mL via OROMUCOSAL

## 2022-03-10 MED ORDER — LEVETIRACETAM IN NACL 500 MG/100ML IV SOLN: 500.0000 mg | Freq: Once | INTRAVENOUS | Status: DC

## 2022-03-10 MED ORDER — PROPOFOL 1000 MG/100ML IV EMUL
0.0000 ug/kg/min | INTRAVENOUS | Status: DC
  Administered 2022-03-10: 50 ug/kg/min via INTRAVENOUS
  Administered 2022-03-11: 25 ug/kg/min via INTRAVENOUS
  Administered 2022-03-11: 50 ug/kg/min via INTRAVENOUS
  Administered 2022-03-11: 20 ug/kg/min via INTRAVENOUS
  Administered 2022-03-11: 40 ug/kg/min via INTRAVENOUS
  Filled 2022-03-10 (×4): qty 100

## 2022-03-10 MED ORDER — LORAZEPAM 2 MG/ML IJ SOLN
INTRAMUSCULAR | Status: AC
  Administered 2022-03-10: 2 mg
  Filled 2022-03-10: qty 1

## 2022-03-10 MED ORDER — ENOXAPARIN SODIUM 40 MG/0.4ML IJ SOSY
40.0000 mg | PREFILLED_SYRINGE | INTRAMUSCULAR | Status: DC
  Administered 2022-03-11: 40 mg via SUBCUTANEOUS
  Filled 2022-03-10 (×2): qty 0.4

## 2022-03-10 MED ORDER — VANCOMYCIN HCL 2000 MG/400ML IV SOLN: 2000.0000 mg | Freq: Once | INTRAVENOUS | Status: DC

## 2022-03-10 MED ORDER — LORAZEPAM 2 MG/ML IJ SOLN
4.0000 mg | Freq: Once | INTRAMUSCULAR | Status: AC
  Administered 2022-03-10: 4 mg via INTRAVENOUS
  Filled 2022-03-10: qty 2

## 2022-03-10 MED ORDER — SODIUM CHLORIDE 0.9 % IV BOLUS
1000.0000 mL | Freq: Once | INTRAVENOUS | Status: AC
  Administered 2022-03-10: 1000 mL via INTRAVENOUS

## 2022-03-10 MED ORDER — DOCUSATE SODIUM 50 MG/5ML PO LIQD
100.0000 mg | Freq: Two times a day (BID) | ORAL | Status: DC
  Administered 2022-03-11 (×2): 100 mg
  Filled 2022-03-10 (×2): qty 10

## 2022-03-10 MED ORDER — DOCUSATE SODIUM 100 MG PO CAPS: 100.0000 mg | ORAL_CAPSULE | Freq: Two times a day (BID) | ORAL | Status: DC | PRN

## 2022-03-10 MED ORDER — ORAL CARE MOUTH RINSE: 15.0000 mL | OROMUCOSAL | Status: DC | PRN

## 2022-03-10 MED ORDER — LORAZEPAM 2 MG/ML IJ SOLN: 1.0000 mg | Freq: Once | INTRAMUSCULAR | Status: DC

## 2022-03-10 MED ORDER — LEVETIRACETAM IN NACL 500 MG/100ML IV SOLN
INTRAVENOUS | Status: AC
  Filled 2022-03-10: qty 100

## 2022-03-10 MED ORDER — DEXAMETHASONE SODIUM PHOSPHATE 10 MG/ML IJ SOLN
10.0000 mg | Freq: Four times a day (QID) | INTRAMUSCULAR | Status: DC
  Administered 2022-03-11 (×3): 10 mg via INTRAVENOUS
  Filled 2022-03-10 (×4): qty 1

## 2022-03-10 MED ORDER — MIDAZOLAM HCL 2 MG/2ML IJ SOLN
2.0000 mg | INTRAMUSCULAR | Status: DC | PRN
  Administered 2022-03-11 (×2): 2 mg via INTRAVENOUS
  Filled 2022-03-10 (×3): qty 2

## 2022-03-10 MED ORDER — DEXTROSE 5 % IV SOLN
950.0000 mg | Freq: Once | INTRAVENOUS | Status: DC
  Filled 2022-03-10: qty 19

## 2022-03-10 MED ORDER — MIDAZOLAM HCL 2 MG/2ML IJ SOLN: 2.0000 mg | INTRAMUSCULAR | Status: DC | PRN

## 2022-03-10 MED ORDER — SODIUM CHLORIDE 0.9 % IV SOLN
2.0000 g | Freq: Once | INTRAVENOUS | Status: AC
  Administered 2022-03-10: 2 g via INTRAVENOUS
  Filled 2022-03-10: qty 20

## 2022-03-10 MED ORDER — LACTATED RINGERS IV SOLN: INTRAVENOUS | Status: DC

## 2022-03-10 MED ORDER — FENTANYL BOLUS VIA INFUSION
50.0000 ug | INTRAVENOUS | Status: DC | PRN
  Administered 2022-03-11: 50 ug via INTRAVENOUS

## 2022-03-10 MED ORDER — ACETAMINOPHEN 325 MG PO TABS
650.0000 mg | ORAL_TABLET | ORAL | Status: DC | PRN
  Filled 2022-03-10: qty 2

## 2022-03-10 MED ORDER — PROPOFOL 1000 MG/100ML IV EMUL
INTRAVENOUS | Status: AC
  Filled 2022-03-10: qty 100

## 2022-03-10 MED ORDER — LEVETIRACETAM IN NACL 1500 MG/100ML IV SOLN: 1500.0000 mg | Freq: Once | INTRAVENOUS | Status: DC

## 2022-03-10 MED ORDER — FENTANYL CITRATE PF 50 MCG/ML IJ SOSY
PREFILLED_SYRINGE | INTRAMUSCULAR | Status: AC
  Filled 2022-03-10: qty 2

## 2022-03-10 MED ORDER — DEXTROSE 5 % IV SOLN: 950.0000 mg | Freq: Three times a day (TID) | INTRAVENOUS | Status: DC

## 2022-03-10 MED ORDER — ETOMIDATE 2 MG/ML IV SOLN
INTRAVENOUS | Status: AC
  Filled 2022-03-10: qty 20

## 2022-03-10 MED ORDER — FENTANYL CITRATE PF 50 MCG/ML IJ SOSY
50.0000 ug | PREFILLED_SYRINGE | Freq: Once | INTRAMUSCULAR | Status: AC
  Administered 2022-03-11: 50 ug via INTRAVENOUS
  Filled 2022-03-10: qty 1

## 2022-03-10 MED ORDER — PANTOPRAZOLE 2 MG/ML SUSPENSION
40.0000 mg | Freq: Every day | ORAL | Status: DC
  Administered 2022-03-11: 40 mg
  Filled 2022-03-10 (×2): qty 20

## 2022-03-10 MED ORDER — VANCOMYCIN HCL IN DEXTROSE 1-5 GM/200ML-% IV SOLN: 1000.0000 mg | Freq: Once | INTRAVENOUS | Status: DC

## 2022-03-10 MED ORDER — CHLORHEXIDINE GLUCONATE CLOTH 2 % EX PADS
6.0000 | MEDICATED_PAD | Freq: Every day | CUTANEOUS | Status: DC
  Administered 2022-03-10 – 2022-03-11 (×2): 6 via TOPICAL

## 2022-03-10 MED ORDER — KETOROLAC TROMETHAMINE 30 MG/ML IJ SOLN
30.0000 mg | Freq: Once | INTRAMUSCULAR | Status: AC
Start: 2022-03-10 — End: 2022-03-10
  Administered 2022-03-10: 30 mg via INTRAVENOUS
  Filled 2022-03-10: qty 1

## 2022-03-10 MED ORDER — VANCOMYCIN HCL IN DEXTROSE 1-5 GM/200ML-% IV SOLN
1000.0000 mg | Freq: Once | INTRAVENOUS | Status: AC
  Administered 2022-03-10: 1000 mg via INTRAVENOUS
  Filled 2022-03-10: qty 200

## 2022-03-10 MED ORDER — LEVETIRACETAM IN NACL 1000 MG/100ML IV SOLN
INTRAVENOUS | Status: AC
  Filled 2022-03-10: qty 300

## 2022-03-10 MED ORDER — LORAZEPAM 2 MG/ML IJ SOLN
2.0000 mg | Freq: Once | INTRAMUSCULAR | Status: DC
  Filled 2022-03-10: qty 1

## 2022-03-10 MED ORDER — LEVETIRACETAM IN NACL 1000 MG/100ML IV SOLN: 3000.0000 mg | Freq: Once | INTRAVENOUS | Status: DC

## 2022-03-10 MED ORDER — POLYETHYLENE GLYCOL 3350 17 G PO PACK: 17.0000 g | PACK | Freq: Every day | ORAL | Status: DC | PRN

## 2022-03-10 NOTE — ED Provider Notes (Signed)
Emergency Department Provider Note   I have reviewed the triage vital signs and the nursing notes.   HISTORY  Chief Complaint Fever and Back Pain   HPI Craig Carlson is a 21 y.o. male past history reviewed below presents emergency department with fever, headache, back discomfort, body aches, mild nasal congestion.  He was on antibiotics 2 weeks ago after being treated for chlamydia and states most of the symptoms have resolved.  He has had some urine frequency but no discharge.  Denies abdominal or chest pain.  No cough or shortness of breath.  He reports headache is similar to his prior migraines and improves with ibuprofen but then comes back.  No sick contacts.  No rash.  Symptoms been ongoing now for 4 days.    Past Medical History:  Diagnosis Date   Anxiety    Depression    Seizures (HCC)     Review of Systems  Constitutional: Positive fever/chills Eyes: No visual changes. ENT: No sore throat. Cardiovascular: Denies chest pain. Respiratory: Denies shortness of breath. Gastrointestinal: No abdominal pain.  No nausea, no vomiting.  No diarrhea.  No constipation. Genitourinary: Positive for dysuria. Musculoskeletal: Positive for back pain. Skin: Negative for rash. Neurological: Negative for focal weakness or numbness. Positive HA.    ____________________________________________   PHYSICAL EXAM:  VITAL SIGNS: ED Triage Vitals  Enc Vitals Group     BP 03/10/22 1843 125/79     Pulse Rate 03/10/22 1843 (!) 116     Resp 03/10/22 1843 20     Temp 03/10/22 1843 (!) 102.3 F (39.1 C)     Temp src --      SpO2 03/10/22 1843 99 %     Weight 03/10/22 1844 256 lb (116.1 kg)     Height 03/10/22 1844 6\' 4"  (1.93 m)   Constitutional: Alert and oriented. Well appearing and in no acute distress. Sitting up in bed and looking well. Joking with staff.  Eyes: Conjunctivae are normal.  Head: Atraumatic. Nose: No congestion/rhinnorhea. Mouth/Throat: Mucous membranes  are moist.  Oropharynx with mild erythema. Neck: No stridor.   Cardiovascular: Normal rate, regular rhythm. Good peripheral circulation. Grossly normal heart sounds.   Respiratory: Normal respiratory effort.  No retractions. Lungs CTAB. Gastrointestinal: Soft and nontender. No distention.  Musculoskeletal: No lower extremity tenderness nor edema. No gross deformities of extremities. Neurologic:  Normal speech and language. No gross focal neurologic deficits are appreciated.  Skin:  Skin is warm, dry and intact. No rash noted.  ____________________________________________   LABS (all labs ordered are listed, but only abnormal results are displayed)  Labs Reviewed  CBC WITH DIFFERENTIAL/PLATELET - Abnormal; Notable for the following components:      Result Value   RBC 5.87 (*)    Monocytes Absolute 1.1 (*)    All other components within normal limits  URINALYSIS, ROUTINE W REFLEX MICROSCOPIC - Abnormal; Notable for the following components:   Protein, ur TRACE (*)    All other components within normal limits  RESP PANEL BY RT-PCR (FLU A&B, COVID) [id number]  GROUP A STREP BY PCR  CULTURE, BLOOD (ROUTINE X 2)  CULTURE, BLOOD (ROUTINE X 2)  URINE CULTURE  CSF CULTURE W GRAM STAIN  MRSA NEXT GEN BY PCR, NASAL  COMPREHENSIVE METABOLIC PANEL  LACTIC ACID, PLASMA  LACTIC ACID, PLASMA  CSF CELL COUNT WITH DIFFERENTIAL  PROTEIN AND GLUCOSE, CSF  GC/CHLAMYDIA PROBE AMP ([place]) NOT AT [facility]     ____________________________________________  RADIOLOGY  CT Head Wo Contrast  Result Date: 03/10/2022 CLINICAL DATA:  Mental status change, unknown cause EXAM: CT HEAD WITHOUT CONTRAST TECHNIQUE: Contiguous axial images were obtained from the base of the skull through the vertex without intravenous contrast. RADIATION DOSE REDUCTION: This exam was performed according to the departmental dose-optimization program which includes automated exposure control, adjustment of the mA and/or kV  according to patient size and/or use of iterative reconstruction technique. COMPARISON:  CT head 05/23/2021 FINDINGS: Brain: No evidence of large-territorial acute infarction. No parenchymal hemorrhage. No mass lesion. No extra-axial collection. No mass effect or midline shift. No hydrocephalus. Basilar cisterns are patent. Vascular: No hyperdense vessel. Skull: No acute fracture or focal lesion. Sinuses/Orbits: Paranasal sinuses and mastoid air cells are clear. The orbits are unremarkable. Other: None. IMPRESSION: No acute intracranial abnormality. Electronically Signed   By: Tish Frederickson M.D.   On: 03/10/2022 21:34   DG Chest Portable 1 View  Result Date: 03/10/2022 CLINICAL DATA:  Intubation, tube placement EXAM: PORTABLE CHEST 1 VIEW COMPARISON:  Portable exam 2052 hours compared to 09/24/2021 FINDINGS: Tip of endotracheal tube projects 3.9 cm above carina. Normal heart size, mediastinal contours, and pulmonary vascularity. RIGHT lateral costal margin excluded. Lungs clear. No pleural effusion or pneumothorax. IMPRESSION: No acute abnormalities. Electronically Signed   By: Ulyses Southward M.D.   On: 03/10/2022 21:03    ____________________________________________   PROCEDURES  Procedure(s) performed:   .Critical Care  Performed by: Maia Plan, MD Authorized by: Maia Plan, MD   Critical care provider statement:    Critical care time (minutes):  90   Critical care time was exclusive of:  Separately billable procedures and treating other patients and teaching time   Critical care was necessary to treat or prevent imminent or life-threatening deterioration of the following conditions:  CNS failure or compromise   Critical care was time spent personally by me on the following activities:  Development of treatment plan with patient or surrogate, discussions with consultants, evaluation of patient's response to treatment, examination of patient, ordering and review of laboratory studies,  ordering and review of radiographic studies, ordering and performing treatments and interventions, pulse oximetry, re-evaluation of patient's condition, review of old charts, blood draw for specimens, ventilator management and obtaining history from patient or surrogate   I assumed direction of critical care for this patient from another provider in my specialty: no     Care discussed with: admitting provider   Procedure Name: Intubation Date/Time: 03/10/2022 10:16 PM  Performed by: Maia Plan, MDPre-anesthesia Checklist: Patient identified, Patient being monitored, Emergency Drugs available, Timeout performed and Suction available Oxygen Delivery Method: Non-rebreather mask Preoxygenation: Pre-oxygenation with 100% oxygen Induction Type: Rapid sequence Ventilation: Mask ventilation without difficulty Laryngoscope Size: Glidescope and 3 Grade View: Grade II Tube size: 7.5 mm Number of attempts: 1 Airway Equipment and Method: Video-laryngoscopy Placement Confirmation: ETT inserted through vocal cords under direct vision, CO2 detector and Breath sounds checked- equal and bilateral Secured at: 26 cm Tube secured with: ETT holder Dental Injury: Teeth and Oropharynx as per pre-operative assessment     .Lumbar Puncture  Date/Time: 03/10/2022 10:18 PM  Performed by: Maia Plan, MD Authorized by: Maia Plan, MD   Consent:    Consent obtained:  Emergent situation Pre-procedure details:    Procedure purpose:  Diagnostic   Preparation: Patient was prepped and draped in usual sterile fashion   Anesthesia:    Anesthesia method:  Local infiltration   Local anesthetic:  Lidocaine 1% w/o epi Procedure details:    Lumbar space:  L4-L5 interspace   Patient position:  L lateral decubitus   Needle gauge:  20   Needle type:  Spinal needle - Quincke tip   Needle length (in):  3.5   Ultrasound guidance: no     Number of attempts:  2 (Unsuccessful) Post-procedure details:    Puncture  site:  Adhesive bandage applied   Procedure completion:  Tolerated well, no immediate complications Comments:     Attempted lumbar puncture at bedside which was unsuccessful x2.  Procedure aborted due to EMS arriving for emergent transport.     ____________________________________________   INITIAL IMPRESSION / ASSESSMENT AND PLAN / ED COURSE  Pertinent labs & imaging results that were available during my care of the patient were reviewed by me and considered in my medical decision making (see chart for details).   This patient is Presenting for Evaluation of fever, which does require a range of treatment options, and is a complaint that involves a high risk of morbidity and mortality.  The Differential Diagnoses include viral illness, CNS infection, CAP, UTI, bacteremia, sepsis, etc.  Critical Interventions-    Medications  lactated ringers infusion (has no administration in time range)  vancomycin (VANCOCIN) IVPB 1000 mg/200 mL premix (has no administration in time range)    Followed by  vancomycin (VANCOCIN) IVPB 1000 mg/200 mL premix (1,000 mg Intravenous New Bag/Given 03/10/22 2129)  LORazepam (ATIVAN) injection 2 mg (has no administration in time range)  levETIRAcetam (KEPRRA) 500 MG/100ML IVPB SOLN (has no administration in time range)  vancomycin (VANCOCIN) IVPB 1000 mg/200 mL premix (has no administration in time range)  acyclovir (ZOVIRAX) 950 mg in dextrose 5 % 250 mL IVPB (has no administration in time range)  levETIRAcetam (KEPPRA) IVPB 1000 mg/100 mL premix (has no administration in time range)  LORazepam (ATIVAN) injection 1 mg (has no administration in time range)  levETIRAcetam (KEPPRA) 1000 MG/100ML IVPB (has no administration in time range)  rocuronium (ZEMURON) injection 100 mg (has no administration in time range)  etomidate (AMIDATE) 2 MG/ML injection (has no administration in time range)  propofol (DIPRIVAN) 1000 MG/100ML infusion (40 mcg/kg/min  116.1 kg  Intravenous New Bag/Given 03/10/22 2048)  propofol (DIPRIVAN) 1000 MG/100ML infusion (has no administration in time range)  Oral care mouth rinse (has no administration in time range)  Oral care mouth rinse (has no administration in time range)  Chlorhexidine Gluconate Cloth 2 % PADS 6 each (has no administration in time range)  sodium chloride 0.9 % bolus 1,000 mL (1,000 mLs Intravenous New Bag/Given 03/10/22 1934)  acetaminophen (TYLENOL) tablet 1,000 mg (1,000 mg Oral Given 03/10/22 1932)  ketorolac (TORADOL) 30 MG/ML injection 30 mg (30 mg Intravenous Given 03/10/22 1935)  dexamethasone (DECADRON) injection 10 mg (10 mg Intravenous Given 03/10/22 2018)  cefTRIAXone (ROCEPHIN) 2 g in sodium chloride 0.9 % 100 mL IVPB (0 g Intravenous Stopped 03/10/22 2100)  LORazepam (ATIVAN) 2 MG/ML injection (2 mg  Given 03/10/22 1950)  LORazepam (ATIVAN) 2 MG/ML injection (2 mg  Given 03/10/22 1956)  etomidate (AMIDATE) injection 20 mg (20 mg Intravenous Given 03/10/22 2042)  rocuronium bromide 100 MG/10ML SOSY (10 mLs  Given 03/10/22 2043)  LORazepam (ATIVAN) injection 4 mg (4 mg Intravenous Given 03/10/22 2140)    Reassessment after intervention: Patient began having generalized tonic-clonic seizure, not responding initially to Ativan which was repeated twice.  Patient then loaded with Keppra and consulted with Dr. Amada Jupiter with neurology.  Loading with additional Keppra and initially ordered fosphenytoin but not available.  Ultimately, patient requiring intubation for airway protection with propofol.    I did obtain Additional Historical Information from family at bedside.   Clinical Laboratory Tests Ordered, included CBC without leukocytosis.  UA without evidence of infection.  COVID and flu negative.  Strep negative.  Lactic acid normal.  No acute kidney injury or electrolyte disturbance.  Attempted lumbar puncture but unsuccessful.   Radiologic Tests Ordered, included CT head and CXR. I independently  interpreted the images and agree with radiology interpretation.   Cardiac Monitor Tracing which shows NSR.    Social Determinants of Health Risk patient is a non-smoker.   Consult complete with Neurology and ICU. Plan for emergent transfer to [facility] ICU for EEG.   Medical Decision Making: Summary:  Patient presents emergency department for evaluation of fever and headache along with body aches.  Some dysuria as well.  Arrives with temp of 102 F.  No significant meningismus on exam.  He looks very well after 4 days of symptoms and my suspicion for bacterial meningitis is very low.  Plan for screening blood work, strep/COVID swab, reassess.   Critical Event:  While awaiting initial blood work I was called into the room reporting that the patient appears to be having a seizure.  Family at bedside report that he is noncompliant with his Lamictal and has seizures fairly frequently.  Patient was given 2 mg of Ativan with no improvement in seizure activity.  He was given additional 2 mg followed by 1500 mg of Keppra.  After consultation with neurology additional Keppra given along with antibiotics, steroid to cover for possible meningitis.  Patient ultimately requiring airway protection with intubation and propofol due to continued seizure activity.  He had several episodes of brief postictal phase but then fairly quickly went back to having seizure.  In discussion with family at bedside, patient is poorly compliant with his seizure medications.  He occasionally will have seizure but does not often go to the hospital for this   Disposition: admit  ____________________________________________  FINAL CLINICAL IMPRESSION(S) / ED DIAGNOSES  Final diagnoses:  Status epilepticus ([place])   Note:  This document was prepared using Dragon voice recognition software and may include unintentional dictation errors.  Nanda Quinton, MD, [facility] Emergency Medicine    Kerby Borner, [facility], MD 03/10/22 2222

## 2022-03-10 NOTE — ED Notes (Signed)
[facility] transported pt to [facility].  

## 2022-03-10 NOTE — Procedures (Signed)
Intubation Procedure Note  Elie Leppo [facility]  [id number]  [birth year]  Date:03/10/22  Time:9:19 PM   Provider Performing:Werner Labella Jerilynn Mages [id number]    Procedure: Intubation (31500)  Indication(s) Respiratory Failure  Consent Unable to obtain consent due to emergent nature of procedure.   Anesthesia     Time Out Verified patient identification, verified procedure, site/side was marked, verified correct patient position, special equipment/implants available, medications/allergies/relevant history reviewed, required imaging and test results available.   Sterile Technique Usual hand hygeine, masks, and gloves were used   Procedure Description Patient positioned in bed supine.  Sedation given as noted above.  Patient was intubated with endotracheal tube using Glidescope.  View was Grade 1 full glottis .  Number of attempts was 1.  Colorimetric CO2 detector was consistent with tracheal placement.   Complications/Tolerance None; patient tolerated the procedure well. Chest X-ray is ordered to verify placement.   EBL     Specimen(s) None EDP intubated pt w/8 ETT, glide MAC3, suction, preoxygenation, BLBS equal post intubation, ETCO2 color change. CXR confirmed tube placement

## 2022-03-10 NOTE — ED Notes (Signed)
RT, RN and Paramedic transported pt to CT and back on vent. BVM on bed, ETT placement assessed before and after transport. Pt respiratory status stable throughout transport. RT will continue to monitor while at [facility].

## 2022-03-10 NOTE — Progress Notes (Signed)
Pharmacy Antibiotic Note  Craig Carlson is a 21 y.o. male admitted on 03/10/2022 with suspected meningitis.  Pharmacy has been consulted for vancomycin and acyclovir dosing.  ClCr > 100 ml/min. BMI 31.2, will use adjusted weight for acyclovir.  Plan: Acyclovir 10mg /kg (= 950 mg) Q8 hr  Vancomycin 2g load then 1000mg  Q 8 hr  Monitor cultures, clinical status, renal function, vancomycin trough Narrow abx as able and f/u duration     Height: 6\' 4"  (193 cm) Weight: 116.1 kg (256 lb) IBW/kg (Calculated) : 86.8  Temp (24hrs), Avg:102.3 F (39.1 C), Min:102.3 F (39.1 C), Max:102.3 F (39.1 C)  Recent Labs  Lab 03/10/22 1912  WBC 7.0    CrCl cannot be calculated (Patient's most recent lab result is older than the maximum 21 days allowed.).    No Known Allergies  Antimicrobials this admission: Vanc 10/8 >>  CTX 10/8 >> Acyclovir 10/2 >>   Dose adjustments this admission: N/a  Microbiology results: 10/8 GAS PCR: neg 10/8 BCx: pend 10/8 UCx: pend  10/8 CSF: pend  10/8 Resp panel: pend  Thank you for allowing pharmacy to be a part of this patient's care.  Benetta Spar, PharmD, BCPS, BCCP Clinical Pharmacist  Please check AMION for all [place] phone numbers After 10:00 PM, call [place] [phone]

## 2022-03-10 NOTE — ED Notes (Signed)
Pt left with [facility], intubated, V/S as noted, report given to both the floor and [facility].

## 2022-03-10 NOTE — ED Notes (Addendum)
Propofol was increased to 46mcg/kg/min at 2136 due to pt. Having seizure activity per order of Dr. Laverta Baltimore.

## 2022-03-10 NOTE — Progress Notes (Signed)
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  Pt completed antibiotics for chlamydia.  He then complained of headaches, fever, neck/back discomfort and body aches.  While int he ED, pt had fever 102F and had witnessed seizure in the ED. He was give ativan, loaded with keppra and intubated for airway protection.    BP 108/57, HR 84, RR 18, O2 sats 96% on the vent.   ABG 7.355/47/251  eICU Interventions  Admit to the ICU.  Continue empiric antibiotics and antiviral - can, ceftriaxone and acyclovir.   LP by IR. Send blood cultures. Check RPR, HIV.  Pt is intubated and sedated on propofol and fentanyl.   Versed PRN for seizures.  Foley cath ordered.  Wrist restraints ordered.  Continue titrating FiO2.  Lovenox for DVT prophylaxis.  PPI for GI prophylaxis.        Intervention Category Evaluation Type: New Patient Evaluation  [name] 03/10/2022, 11:26 PM

## 2022-03-10 NOTE — ED Notes (Signed)
Floor notified about the OG tube being placed by [facility] at facility and the need for a X-Ray to be performed upon arrival.

## 2022-03-10 NOTE — ED Notes (Signed)
Report given to the Floor RN. 

## 2022-03-10 NOTE — ED Notes (Signed)
[facility] given Report 

## 2022-03-10 NOTE — ED Notes (Signed)
Seizure pads placed. 02 sats have remained 100% throughout seizure activity. Pt remains ST 130s during seizure activity.

## 2022-03-10 NOTE — ED Notes (Signed)
OG tube inserted by [facility].

## 2022-03-10 NOTE — ED Notes (Addendum)
#  16 temp foley placed by Julaine Fusi,  RN using sterile tech with immediate clear yellow urine noted.

## 2022-03-10 NOTE — ED Notes (Signed)
Pt was intubated by Dr. Laverta Baltimore. Tolerated well.

## 2022-03-10 NOTE — ED Notes (Signed)
RT placed PIV in right AC, secured, flushed, saline locked.

## 2022-03-10 NOTE — ED Notes (Signed)
RT placed pt on [place] 2 Lpm per MD while pt actively seizing. Pt respiratory status is stable w/no distress noted at this time. RT will continue to monitor while in [facility] ED.

## 2022-03-10 NOTE — ED Triage Notes (Addendum)
Patient arrives with complaints of fevers, neck/back pain, vomiting, and urinary frequency x2 days.   Rates pain and 20/10.  Patient recently tested positive for Chlamydia 1 week ago as well. Completed his course of antibiotics, but he would like to be rechecked.  Patient states that he also took 6 Ibuprofen for pain relief earlier today.

## 2022-03-10 NOTE — H&P (Signed)
NAME:  Craig Carlson, MRN:  [id number], DOB:  07-07-2000, LOS: 0 ADMISSION DATE:  03/10/2022, CONSULTATION DATE:  10/8 REFERRING MD:  Dr. Jacqulyn Bath, CHIEF COMPLAINT:  Status Epilepticus   History of Present Illness:  Patient is a 21 year old male with pertinent PMH of seizures on Lamictal, STDs (gonorrhea/chlamydia) presents to [facility] on 10/8 with fever and malaise.  Patient states about 2 weeks ago he was being treated for chlamydia and has finished taking his Doxy.  Denies any urine discharge.  Over the past 4 days patient has been complaining of headaches, fever, neck/back discomfort, and body aches.  Patient denies any SOB, cough, abdominal/chest pain.  Of note, family states that patient is noncompliant with Lamictal for seizures and has them fairly frequently.  Came to [facility] for further eval on 10/8.  Patient fever 102 F.  BP and respiratory status stable in NAD.  HR 116.  Patient initially AOx3.  No meningismus based on exam.  While awaiting labs patient began having episode of seizure.  Given 2 Mg of Ativan with no improvement in seizure.  Loaded with Keppra.  Patient altered and required intubation for airway protection.  CT head with no acute abnormality.  LP pending.  Neuro consulted.   Patient being transferred to [facility] for further eval.  PCCM consulted.  Pertinent ED labs: CXR showing normal lungs with ETT 3.9 cm above carina. UA WNL; UC pending.  BC x2 pending.  CMP/CBC WNL.  LA 1.7.  COVID/flu negative. GC/chlamydia pending.  Pertinent  Medical History   Past Medical History:  Diagnosis Date   Anxiety    Depression    Seizures (HCC)   STDS: Recent chlamydia positive; hx of gonorrhea  Significant Hospital Events: Including procedures, antibiotic start and stop dates in addition to other pertinent events   10/8: Admitted to [facility] fever and neck/back pain; episode of seizure and intubated for airway protection; LP pending; PCCM consulted and transferring  to [facility]  Interim History / Subjective:  See above  Objective   Blood pressure (!) 143/82, pulse (!) 111, temperature (!) 102.3 F (39.1 C), resp. rate 19, height 6\' 1"  (1.854 m), weight 116.1 kg, SpO2 99 %.    FiO2 (%):  [28 %] 28 %   Intake/Output Summary (Last 24 hours) at 03/10/2022 2107 Last data filed at 03/10/2022 2100 Gross per 24 hour  Intake 50 ml  Output --  Net 50 ml   Filed Weights   03/10/22 1844  Weight: 116.1 kg    Examination: General:  critically ill appearing on mech vent HEENT: MM pink/moist; ETT in place Neuro: sedated on 60 mcg prop; withdraws to pain; cough/gag present and moves all extremities; pupils 78mm reactive CV: s1s2, RRR, no m/r/g PULM:  dim clear BS bilaterally; on mech vent PRVC GI: soft, bsx4 active  Extremities: warm/dry, no edema  Skin: no rashes or lesions appreciated   Resolved Hospital Problem list     Assessment & Plan:  Seizure: hx of seizure on Lamictal; noncompliant per family Acute encephalopathy Possible meningitis/encephalitis: fever, HA, neck pain -CT head no acute abnormality; UA WNL P: -will admit to ICU w/ continuous telemetry monitoring -neuro following; appreciate recs -will start on acyclovir, vanc, ceftriaxone for meningitis/encephalitis Ppx -Steroids -LP attempted by ED but unsuccessful; IR consult placed for LP -check RPR given STD history -check cultures: bcx2, urine culture/UA -EEG and AEDs per neuro -Propofol/fentanyl for sedation -As needed Versed for seizures -Seizure precautions in place -send UDS,  ethanol, ammonia, salicylate, acetaminophen level -consider MRI -frequent neuro checks -limit sedating meds  Acute respiratory failure due to poor GCS P: -LTVV strategy with tidal volumes of 6-8 cc/kg ideal body weight -CXR obtained on arrival w/ ETT in good position -check ABG and adjust settings accordingly  -Wean PEEP/FiO2 for SpO2 >92% -VAP bundle in place -Daily SAT and SBT -PAD protocol in  place -wean sedation for RASS goal 0 to -1  Sepsis -CXR/UA appear WNL P: -concern for possible meningitis/encephalitis -ABX/antivirals as above -IR to perform LP: follow up LP labs and cultures -trend lactate -IV fluids -tylenol for fever -Trend WBC/fever curve -check PCT  Hx of STDs: Recent chlamydia +2 weeks ago finished course of Doxy; hx of gonorrhea P: -Follow-up GC/chlamydia -check RPR and HIV  Hx of anxiety/depression P: -Hold home trazodone  Best Practice (right click and "Reselect all SmartList Selections" daily)   Diet/type: NPO w/ meds via tube DVT prophylaxis: LMWH GI prophylaxis: PPI Lines: N/A Foley:  N/A Code Status:  full code Last date of multidisciplinary goals of care discussion Pervis Hocking over phone and updated.)  Labs   CBC: Recent Labs  Lab 03/10/22 1912  WBC 7.0  NEUTROABS 3.0  HGB 16.4  HCT 48.9  MCV 83.3  PLT 203    Basic Metabolic Panel: Recent Labs  Lab 03/10/22 1912  NA 139  K 4.1  CL 102  CO2 26  GLUCOSE 82  BUN 8  CREATININE 1.20  CALCIUM 9.2   GFR: Estimated Creatinine Clearance: 131.1 mL/min (by C-G formula based on SCr of 1.2 mg/dL). Recent Labs  Lab 03/10/22 1912  WBC 7.0  LATICACIDVEN 1.7    Liver Function Tests: Recent Labs  Lab 03/10/22 1912  AST 37  ALT 44  ALKPHOS 79  BILITOT 0.6  PROT 7.6  ALBUMIN 4.3   No results for input(s): "LIPASE", "AMYLASE" in the last 168 hours. No results for input(s): "AMMONIA" in the last 168 hours.  ABG No results found for: "PHART", "PCO2ART", "PO2ART", "HCO3", "TCO2", "ACIDBASEDEF", "O2SAT"   Coagulation Profile: No results for input(s): "INR", "PROTIME" in the last 168 hours.  Cardiac Enzymes: No results for input(s): "CKTOTAL", "CKMB", "CKMBINDEX", "TROPONINI" in the last 168 hours.  HbA1C: No results found for: "HGBA1C"  CBG: No results for input(s): "GLUCAP" in the last 168 hours.  Review of Systems:   Patient is encephalopathic and/or  intubated. Therefore history has been obtained from chart review.    Past Medical History:  He,  has a past medical history of Anxiety, Depression, and Seizures (HCC).   Surgical History:  No past surgical history on file.   Social History:   reports that he has never smoked. He has never used smokeless tobacco. He reports that he does not currently use alcohol after a past usage of about 1.0 - 2.0 standard drink of alcohol per week. He reports that he does not currently use drugs after having used the following drugs: Marijuana.   Family History:  His family history includes Cancer in his maternal aunt; Diabetes in his father, maternal grandmother, mother, and paternal grandmother; Heart attack in his maternal grandmother.   Allergies No Known Allergies   Home Medications  Prior to Admission medications   Medication Sig Start Date End Date Taking? Authorizing Provider  dicyclomine (BENTYL) 20 MG tablet Take 1 tablet (20 mg total) by mouth 2 (two) times daily for 5 days. 04/29/21 05/04/21  Sloan Leiter, DO  doxycycline (VIBRAMYCIN) 100 MG capsule Take  1 capsule (100 mg total) by mouth 2 (two) times daily. 7/89/38   Campbell Stall P, DO  erythromycin ophthalmic ointment Place a 1/2 inch ribbon of ointment into the lower eyelid up to 6 times a day for one week. 12/30/21   Tegeler, Gwenyth Allegra, MD  lamoTRIgine (LAMICTAL) 25 MG tablet Take 25 mg by mouth daily. 09/28/21   [provider]  naproxen (NAPROSYN) 500 MG tablet Take 1 tablet (500 mg total) by mouth 2 (two) times daily as needed. 09/24/21   Rayna Sexton, PA-C  sucralfate (CARAFATE) 1 g tablet Take 1 tablet (1 g total) by mouth 4 (four) times daily -  with meals and at bedtime for 7 days. 04/29/21 05/06/21  Jeanell Sparrow, DO  traZODone (DESYREL) 50 MG tablet Take 0.5-1 tablets (25-50 mg total) by mouth at bedtime as needed for sleep. 01/12/21   Montel Culver, MD  Vitamin D, Ergocalciferol, (DRISDOL) 1.25 MG (50000 UNIT)  CAPS capsule Take 1 capsule (50,000 Units total) by mouth every 7 (seven) days. Take for 8 total doses(weeks) 01/29/21   Montel Culver, MD     Critical care time: 45 minutes    JD Rexene Agent [facility] 03/10/2022, 9:07 PM  Please see [email or web address] for pager details.  From 7A-7P if no response, please call [phone]. After hours, please call ELink [phone].

## 2022-03-10 NOTE — ED Notes (Signed)
RT called report to receiving RT [facility].

## 2022-03-10 NOTE — ED Notes (Signed)
Pt moved to resus room to prepare for intubation due to seizure activity. Dr. Laverta Baltimore, RT, RN, Paramedic, NT, at bedside.

## 2022-03-10 NOTE — ED Notes (Addendum)
Family walked out of the Pts room and requested assistance due to the Pt "shaking" and not being alert. Upon arrival into the room, Pt found seizing and not alert. Provider was informed and Ativan was requested. Ativan was overridden on the [place] and given. Meds given a few mins after over ridden time.  Pts V/S were HR tachycardic, SpO2 never under 95% and had Pulses in the wrist/cap refill.

## 2022-03-10 NOTE — ED Notes (Signed)
Return from CT scan. Dr. Laverta Baltimore at bedside for emergent LP.

## 2022-03-11 ENCOUNTER — Inpatient Hospital Stay (HOSPITAL_COMMUNITY): Payer: Medicaid Other

## 2022-03-11 DIAGNOSIS — G40901 Epilepsy, unspecified, not intractable, with status epilepticus: Secondary | ICD-10-CM | POA: Diagnosis not present

## 2022-03-11 LAB — COMPREHENSIVE METABOLIC PANEL
ALT: 49 U/L — ABNORMAL HIGH (ref 0–44)
AST: 41 U/L (ref 15–41)
Albumin: 3.5 g/dL (ref 3.5–5.0)
Alkaline Phosphatase: 71 U/L (ref 38–126)
Anion gap: 9 (ref 5–15)
BUN: 8 mg/dL (ref 6–20)
CO2: 21 mmol/L — ABNORMAL LOW (ref 22–32)
Calcium: 8.8 mg/dL — ABNORMAL LOW (ref 8.9–10.3)
Chloride: 107 mmol/L (ref 98–111)
Creatinine, Ser: 1.14 mg/dL (ref 0.61–1.24)
GFR, Estimated: >60 mL/min (ref >60)
Glucose, Bld: 159 mg/dL — ABNORMAL HIGH (ref 70–99)
Potassium: 4.4 mmol/L (ref 3.5–5.1)
Sodium: 137 mmol/L (ref 135–145)
Total Bilirubin: 0.8 mg/dL (ref 0.3–1.2)
Total Protein: 6.9 g/dL (ref 6.5–8.1)

## 2022-03-11 LAB — ACETAMINOPHEN LEVEL: Acetaminophen (Tylenol), Serum: <10 ug/mL — ABNORMAL LOW (ref 10–30)

## 2022-03-11 LAB — CSF CELL COUNT WITH DIFFERENTIAL
RBC Count, CSF: 64 /mm3 — ABNORMAL HIGH
Tube #: 3
WBC, CSF: 7 /mm3 — ABNORMAL HIGH (ref 0–5)

## 2022-03-11 LAB — RPR: RPR Ser Ql: NONREACTIVE

## 2022-03-11 LAB — GLUCOSE, CAPILLARY
Glucose-Capillary: 179 mg/dL — ABNORMAL HIGH (ref 70–99)
Glucose-Capillary: 170 mg/dL — ABNORMAL HIGH (ref 70–99)
Glucose-Capillary: 162 mg/dL — ABNORMAL HIGH (ref 70–99)
Glucose-Capillary: 123 mg/dL — ABNORMAL HIGH (ref 70–99)
Glucose-Capillary: 124 mg/dL — ABNORMAL HIGH (ref 70–99)
Glucose-Capillary: 113 mg/dL — ABNORMAL HIGH (ref 70–99)
Glucose-Capillary: 124 mg/dL — ABNORMAL HIGH (ref 70–99)

## 2022-03-11 LAB — MENINGITIS/ENCEPHALITIS PANEL (CSF)

## 2022-03-11 LAB — CBC
HCT: 46.9 % (ref 39.0–52.0)
Hemoglobin: 15.7 g/dL (ref 13.0–17.0)
MCH: 28 pg (ref 26.0–34.0)
MCHC: 33.5 g/dL (ref 30.0–36.0)
MCV: 83.6 fL (ref 80.0–100.0)
Platelets: 213 10*3/uL (ref 150–400)
RBC: 5.61 MIL/uL (ref 4.22–5.81)
RDW: 12.6 % (ref 11.5–15.5)
WBC: 7.1 10*3/uL (ref 4.0–10.5)
nRBC: 0 % (ref 0.0–0.2)

## 2022-03-11 LAB — RAPID URINE DRUG SCREEN, HOSP PERFORMED
Amphetamines: NOT DETECTED
Barbiturates: NOT DETECTED
Benzodiazepines: POSITIVE — AB
Cocaine: NOT DETECTED
Opiates: NOT DETECTED
Tetrahydrocannabinol: NOT DETECTED

## 2022-03-11 LAB — AMMONIA: Ammonia: 42 umol/L — ABNORMAL HIGH (ref 9–35)

## 2022-03-11 LAB — MAGNESIUM: Magnesium: 1.8 mg/dL (ref 1.7–2.4)

## 2022-03-11 LAB — PROCALCITONIN
Procalcitonin: 0.2 ng/mL
Procalcitonin: 0.17 ng/mL

## 2022-03-11 LAB — TRIGLYCERIDES: Triglycerides: 115 mg/dL (ref <150)

## 2022-03-11 LAB — PROTEIN, CSF: Total  Protein, CSF: 47 mg/dL — ABNORMAL HIGH (ref 15–45)

## 2022-03-11 LAB — HEMOGLOBIN A1C
Hgb A1c MFr Bld: 5.7 % — ABNORMAL HIGH (ref 4.8–5.6)
Mean Plasma Glucose: 116.89 mg/dL

## 2022-03-11 LAB — MRSA NEXT GEN BY PCR, NASAL: MRSA by PCR Next Gen: NOT DETECTED

## 2022-03-11 LAB — HIV ANTIBODY (ROUTINE TESTING W REFLEX): HIV Screen 4th Generation wRfx: NONREACTIVE

## 2022-03-11 LAB — SALICYLATE LEVEL: Salicylate Lvl: <7 mg/dL — ABNORMAL LOW (ref 7.0–30.0)

## 2022-03-11 LAB — GLUCOSE, CSF: Glucose, CSF: 92 mg/dL — ABNORMAL HIGH (ref 40–70)

## 2022-03-11 LAB — ETHANOL: Alcohol, Ethyl (B): <10 mg/dL (ref <10)

## 2022-03-11 LAB — LACTIC ACID, PLASMA: Lactic Acid, Venous: 1.8 mmol/L (ref 0.5–1.9)

## 2022-03-11 MED ORDER — PHENYTOIN SODIUM 50 MG/ML IJ SOLN
100.0000 mg | Freq: Three times a day (TID) | INTRAMUSCULAR | Status: DC
  Administered 2022-03-11: 100 mg via INTRAVENOUS
  Filled 2022-03-11 (×2): qty 2

## 2022-03-11 MED ORDER — NALOXONE HCL 0.4 MG/ML IJ SOLN
INTRAMUSCULAR | Status: AC
  Filled 2022-03-11: qty 1

## 2022-03-11 MED ORDER — LIDOCAINE HCL (PF) 1 % IJ SOLN
5.0000 mL | Freq: Once | INTRAMUSCULAR | Status: AC
  Administered 2022-03-11: 5 mL via INTRADERMAL

## 2022-03-11 MED ORDER — CALCIUM GLUCONATE-NACL 1-0.675 GM/50ML-% IV SOLN
1.0000 g | Freq: Once | INTRAVENOUS | Status: AC
  Administered 2022-03-11: 1000 mg via INTRAVENOUS
  Filled 2022-03-11: qty 50

## 2022-03-11 MED ORDER — SODIUM CHLORIDE 0.9 % IV SOLN
1500.0000 mg | Freq: Once | INTRAVENOUS | Status: AC
  Administered 2022-03-11: 1500 mg via INTRAVENOUS
  Filled 2022-03-11: qty 30

## 2022-03-11 MED ORDER — DEXMEDETOMIDINE HCL IN NACL 400 MCG/100ML IV SOLN
0.0000 ug/kg/h | INTRAVENOUS | Status: DC
  Filled 2022-03-11: qty 100

## 2022-03-11 MED ORDER — LEVETIRACETAM IN NACL 1500 MG/100ML IV SOLN
1500.0000 mg | Freq: Once | INTRAVENOUS | Status: DC
  Filled 2022-03-11: qty 100

## 2022-03-11 MED ORDER — MIDAZOLAM HCL 2 MG/2ML IJ SOLN
INTRAMUSCULAR | Status: AC
  Filled 2022-03-11: qty 4

## 2022-03-11 MED ORDER — SODIUM CHLORIDE 0.9 % IV SOLN
3000.0000 mg | Freq: Once | INTRAVENOUS | Status: AC
  Administered 2022-03-11: 3000 mg via INTRAVENOUS
  Filled 2022-03-11: qty 30

## 2022-03-11 MED ORDER — MIDAZOLAM HCL 2 MG/2ML IJ SOLN: 4.0000 mg | Freq: Once | INTRAMUSCULAR | Status: DC

## 2022-03-11 MED ORDER — INSULIN ASPART 100 UNIT/ML IJ SOLN
0.0000 [IU] | INTRAMUSCULAR | Status: DC
  Administered 2022-03-11: 3 [IU] via SUBCUTANEOUS
  Administered 2022-03-11: 2 [IU] via SUBCUTANEOUS
  Administered 2022-03-11: 3 [IU] via SUBCUTANEOUS
  Administered 2022-03-11: 2 [IU] via SUBCUTANEOUS

## 2022-03-11 MED ORDER — PHENYTOIN SODIUM 50 MG/ML IJ SOLN
100.0000 mg | Freq: Three times a day (TID) | INTRAMUSCULAR | Status: DC
  Filled 2022-03-11: qty 2

## 2022-03-11 NOTE — Progress Notes (Signed)
Patient was extubated today around 2pm per respiratory placed on [place] and now currently on room air.  While working with therapy this afternoon patient appeared to be having a seizure Dr Tamala Julian and Dr. Hortense Ramal made aware.  Informed to not escalate medications for seizure by Dr Hortense Ramal due to them being pseudoseizures.  A few minutes after the seizure activity patient was arguing with girlfriend at the bedside.  His fiance [name] also called to check on him since he was awake and talking she wanted me to let him know she was still being induced on wed since his phone was dead.  He has also mentioned several times that he wants to go informed Dr Tamala Julian.

## 2022-03-11 NOTE — Evaluation (Signed)
Physical Therapy Evaluation Patient Details Name:  MRN: [id number] DOB: 2000/10/13 Today's Date: 03/11/2022  History of Present Illness  pt is a 21 y/o male presenting to [facility] ED on 10/8 with fever and malaise.  Reports last 4 days complaints of HA, fever, neck/back discomfort and body aches.  pt's family reports patient is noncompliant with the lamictal for seizures and has had seizure.  Work up ongoing for Acute encephalopathy, concern for meningitis/encephalitis.  Continuous EEG study ongoing.  PMHx seizure, depression.  Clinical Impression  Pt admitted with/for problem described above.  Pt started to get up and started seizure.  Later up sitting EOB against nurses and fiance's wishes.  Expect that pt will progress quickly as etiology determined..  Pt currently limited functionally due to the problems listed below.  (see problems list.)  Pt will benefit from PT to maximize function and safety to be able to get home safely with available assist.        Recommendations for follow up therapy are one component of a multi-disciplinary discharge planning process, led by the attending physician.  Recommendations may be updated based on patient status, additional functional criteria and insurance authorization.  Follow Up Recommendations No PT follow up (if pt clears neurologically as expected.)      Assistance Recommended at Discharge Set up Supervision/Assistance  Patient can return home with the following  Assist for transportation    Equipment Recommendations None recommended by PT  Recommendations for Other Services       Functional Status Assessment Patient has had a recent decline in their functional status and demonstrates the ability to make significant improvements in function in a reasonable and predictable amount of time.     Precautions / Restrictions Precautions Precautions: Other (comment) (seizure)      Mobility  Bed Mobility Overal bed mobility: Modified  Independent             General bed mobility comments: rolling and scooting around in bed.    Transfers                   General transfer comment: aborted due to the moment patient was to get to EOB, he had 2-3 runs of seizure/pseudoseizure.    Ambulation/Gait                  Stairs            Wheelchair Mobility    Modified Rankin (Stroke Patients Only)       Balance                                             Pertinent Vitals/Pain Pain Assessment Pain Assessment: Faces Faces Pain Scale: No hurt Pain Intervention(s): Monitored during session    Home Living Family/patient expects to be discharged to:: Private residence Living Arrangements: Spouse/significant other;Other relatives (sister and a daughter) Available Help at Discharge: Family;Available PRN/intermittently;Available 24 hours/day Type of Home: House Home Access: Stairs to enter Entrance Stairs-Rails: Lawyer of Steps: several   Home Layout: Two level;Able to live on main level with bedroom/bathroom        Prior Function Prior Level of Function : Independent/Modified Independent;Working/employed;Driving                     Hand Dominance        Extremity/Trunk Assessment  Upper Extremity Assessment Upper Extremity Assessment: Overall WFL for tasks assessed    Lower Extremity Assessment Lower Extremity Assessment: Overall WFL for tasks assessed (moving against gravity, but not tested fully)    Cervical / Trunk Assessment Cervical / Trunk Assessment: Normal  Communication   Communication: No difficulties  Cognition Arousal/Alertness: Awake/alert Behavior During Therapy: WFL for tasks assessed/performed Overall Cognitive Status: Difficult to assess                                          General Comments General comments (skin integrity, edema, etc.): During pseudoseizures, HR rose into the  140's sustained,  Sats on RA  remained 92 or better through out the seizure period.    Exercises     Assessment/Plan    PT Assessment Patient needs continued PT services  PT Problem List Decreased strength;Decreased activity tolerance;Cardiopulmonary status limiting activity;Decreased mobility       PT Treatment Interventions Gait training;Stair training;Functional mobility training;Therapeutic activities;Balance training;Patient/family education    PT Goals (Current goals can be found in the Care Plan section)  Acute Rehab PT Goals Patient Stated Goal: out of here PT Goal Formulation: With patient/family Time For Goal Achievement: 03/25/22 Potential to Achieve Goals: Good    Frequency Min 3X/week     Co-evaluation               AM-PAC PT "6 Clicks" Mobility  Outcome Measure Help needed turning from your back to your side while in a flat bed without using bedrails?: A Little Help needed moving from lying on your back to sitting on the side of a flat bed without using bedrails?: A Little Help needed moving to and from a bed to a chair (including a wheelchair)?: A Little Help needed standing up from a chair using your arms (e.g., wheelchair or bedside chair)?: A Little Help needed to walk in hospital room?: A Little Help needed climbing 3-5 steps with a railing? : A Little 6 Click Score: 18    End of Session Equipment Utilized During Treatment: Oxygen Activity Tolerance: Treatment limited secondary to medical complications (Comment) (seizure) Patient left: in bed;with call bell/phone within reach;with nursing/sitter in room;with family/visitor present Nurse Communication: Mobility status PT Visit Diagnosis: Other abnormalities of gait and mobility (R26.89);Other symptoms and signs involving the nervous system (X21.194)    Time: 1740-8144 PT Time Calculation (min) (ACUTE ONLY): 21 min   Charges:   PT Evaluation $PT Eval Moderate Complexity: 1 Mod           03/11/2022  Ginger Carne., PT Acute Rehabilitation Services [phone]  (office)  Tessie Fass Jahari Billy 03/11/2022, 5:11 PM

## 2022-03-11 NOTE — Progress Notes (Signed)
LTM maint complete - no skin breakdown under: A1 A2 Fp1 Fp2 Serviced A1 A2 T7 [facility] monitored, Event button test confirmed by [facility]. Patient has chose to stay for antibiotics and treatment for seizures.

## 2022-03-11 NOTE — Progress Notes (Signed)
LTM maint attempted - no skin breakdown seen During brief encounter  Patient and "sister" wanting to leave AMA. No results have been discussed with pt and family per pt and family.  Tech has not repaired leads at this moment.

## 2022-03-11 NOTE — Progress Notes (Signed)
eLink Physician-Brief Progress Note Patient Name: Craig Carlson DOB: 05-04-2001 MRN: [id number]   Date of Service  03/11/2022  HPI/Events of Note  Notified of glucose at 179.  Pt is non-diabetic but getting steroids.  Calcium at 8.8 with normal albumin.   eICU Interventions  Calcium gluconate 1g IV ordered.  Insulin sliding scale ordered q4hrs.      Intervention Category Intermediate Interventions: Other:  [name] 03/11/2022, 4:12 AM

## 2022-03-11 NOTE — Progress Notes (Signed)
Pt requesting to leave AMA and taking off patient care equipment and refusing to let this nurse re-apply telemetry equipment. Pt removing EKG leads, pulse oximetry and threatening to remove ivs. Pt at edge of bed and refusing to listen to this RN in regards to safety precautions and potential risks without continuous monitoring. E-Link called in regards to this patient wanting to leave AMA. EEG tech in the room alongside this RN. While on call doctor discuss with patient wanting to leave AMA, pt requests that "I need a minute, I need everybody to leave." Pt refusing to allow this RN to re-apply telemetry equipment and not adhering to safety precautions.

## 2022-03-11 NOTE — Progress Notes (Signed)
Pt agreeable to stay and be monitored. Pt allowed this RN to re-apply telemetry equipment. Pt advised not to get up without RN or other qualified healthcare personnel present, and that he is hooked up to medical equipment that could limit movement out of bed and cause him to fall without healthcare personnel present. Pt states "that can't stop me." Bed alarm on.

## 2022-03-11 NOTE — Progress Notes (Signed)
No ongoing seizure on rapid EEG, full report to follow tomorrow.  Roland Rack, MD [facility] [phone]  If 7pm- 7am, please page neurology on call as listed in [place].

## 2022-03-11 NOTE — Progress Notes (Signed)
eLink Physician-Brief Progress Note Patient Name: Craig Carlson DOB: 07-15-2000 MRN: [id number]   Date of Service  03/11/2022  HPI/Events of Note  Patient is threatening to sign out against medical advice.  eICU Interventions  I explained to the patient in detail the concern about his having a serious and potentially life threatening infection or other illness, and the extreme inadvisability of his signing out against medical advice, I updated him about ongoing testing and treatment, including the rational for cEEG, I answered all posed questions. Patient indicated understanding and wanted time alone to think about the information he received.        Kerry Kass Clif Serio 03/11/2022, 7:46 PM

## 2022-03-11 NOTE — Progress Notes (Signed)
NAME:  Craig Carlson, MRN:  [id number], DOB:  [birth year], LOS: 1 ADMISSION DATE:  03/10/2022, CONSULTATION DATE:  10/08 REFERRING MD:  EDP CHIEF COMPLAINT:  Status Epilepticus   History of Present Illness:  Patient is a 21 year old male with pertinent PMH of seizures on Lamictal, STDs (gonorrhea/chlamydia) presents to [facility] on 10/8 with fever and malaise.   Patient states about 2 weeks ago he was being treated for chlamydia and has finished taking his Doxy.  Denies any urine discharge.  Over the past 4 days patient has been complaining of headaches, fever, neck/back discomfort, and body aches.  Patient denies any SOB, cough, abdominal/chest pain.  Of note, family states that patient is noncompliant with Lamictal for seizures and has them fairly frequently.  Came to [facility] ED for further eval on 10/8.   Patient fever 102 F.  BP and respiratory status stable in NAD.  HR 116.  Patient initially AOx3.  No meningismus based on exam.  While awaiting labs patient began having episode of seizure.  Given 2 Mg of Ativan with no improvement in seizure.  Loaded with Keppra.  Patient altered and required intubation for airway protection. CT head with no acute abnormality.  LP pending.  Neuro consulted.   Patient being transferred to [facility] for further eval.  PCCM consulted.   Pertinent ED labs: CXR showing normal lungs with ETT 3.9 cm above carina. CBC wnl except mild erythrocytosis. CMP, UA, LA, COVID, Flu, group A strep WNL; Pro cal 0.20, UC pending.  BC x2 pending. GC/chlamydia pending. Alcohol and acetaminophen level <10. Mag 1.8.   Pertinent  Medical History  Anxiety, Depression, Seizures, hx of chlamydia, hx of gonorrhea Significant Hospital Events: Including procedures, antibiotic start and stop dates in addition to other pertinent events    Interim History / Subjective:  No acute events overnight. Given 1g of IV calcium. Insulin slidings scale.  Review of Systems:   Negative unless  stated in the HPI.  Objective   Blood pressure 114/64, pulse 87, temperature (!) 97.5 F (36.4 C), resp. rate 18, height 6\' 1"  (1.854 m), weight 118.1 kg, SpO2 97 %.    Vent Mode: PRVC FiO2 (%):  [28 %-100 %] 50 % Set Rate:  [15 bmp-18 bmp] 18 bmp Vt Set:  [470 mL-570 mL] 570 mL PEEP:  [5 cmH20] 5 cmH20 Plateau Pressure:  [17 cmH20-18 cmH20] 17 cmH20   Intake/Output Summary (Last 24 hours) at 03/11/2022 0730 Last data filed at 03/11/2022 05/11/2022 Gross per 24 hour  Intake 1412.32 ml  Output 1450 ml  Net -37.68 ml   Filed Weights   03/10/22 1844 03/11/22 0246  Weight: 116.1 kg 118.1 kg   Examination: General: NAD, laying comfortable in bed HENT: NCAT Lungs: CTAB Cardiovascular: Sinus tachycardia Abdomen: Normal bowel sounds Extremities: No asymmetry Neuro: sedated. Moves all extremities sporadically but not to command GU: foley in place  Diagnostics CBC wnl. CMP with hypocalcemia at 8.8 and ALT elevated mildly at 49. LA normal. A1c 5.7. Keppra level pending. UDS pending. BCX and UCX pending. LP studies.  Consults  Neurology Resolved Hospital Problem list    Assessment & Plan:  #Seizure disorder c/b status epilepticus #Acute Encephalopathy #Concern for meningitis/encephalitis Pt has hx of seizures and on lamictal at home and non-complaint per family. Pt had 4 days of feeling unwell prior to presentation per sister. Pt was complaining of headache, neck pain and having nausea and vomiting along with fevers. Pt has seizures about twice per month  and misses his medications. Appears pt could have infectious etiology to his status epilepticus with the symptoms prior to presentation. LP planned for today. Plan to continue sedation at current rate. Plan to continue Rocephin 2 g BID, Vanc 1 g q8hrs, and acyclovir 950 mg q8hrs for empiric meningitis coverage. Plan to continue Dilantin and Keppra. LT EEG pending and will wean sedation once pt not having seizures with current regimen. Continue  Decadron 10 mg q6hrs.  -Continue above meds -If EEG negative for seizures then stop gtt sedation meds and monitor further.  -Continue abx, antiepileptics and steroid as above.  -Follow up pending studies.   #Acute Respiratory Failure 2/2 to problem 1 Will keep sedated and on vent until pt is seizure free and has the LP performed. Plan to extubate later today if able.  -Continue vent support -VAP protocol in place.  -RASS goal of -1 to -2.  #Hx of STD GC/Chlamydia pending. Was positive in 01/2022 and pt treated with doxycline. Unsure if pt completed regimen. On chart review pt has multiple tests of GC/Chlamydia suggestive of at risk behavior.   #Hx of Anxiety/Depression Restart home trazodone once able.   #Prediabetes A1c slightly elevated at 5.7 suggestive of prediabetes. Will counsel on lifestyle mods once acute illness subsides.   Best Practice (right click and "Reselect all SmartList Selections" daily)  Diet/type: NPO w/ meds via tube DVT prophylaxis: LMWH GI prophylaxis: PPI Lines: N/A Foley:  N/A Continuous: Fentanyl 50 mcg and propofol 25 mcg, LR at 150 cc/hr Code Status:  full code Last date of multidisciplinary goals of care discussion [10/08] Labs   CBC: Recent Labs  Lab 03/10/22 1912 03/10/22 2253 03/10/22 2311 03/11/22 0205  WBC 7.0  --  7.0 7.1  NEUTROABS 3.0  --   --   --   HGB 16.4 15.3 15.3 15.7  HCT 48.9 45.0 45.3 46.9  MCV 83.3  --  83.9 83.6  PLT 203  --  146* 213   Basic Metabolic Panel: Recent Labs  Lab 03/10/22 1912 03/10/22 2253 03/10/22 2311 03/11/22 0205  NA 139 138  --  137  K 4.1 3.9  --  4.4  CL 102  --   --  107  CO2 26  --   --  21*  GLUCOSE 82  --   --  159*  BUN 8  --   --  8  CREATININE 1.20  --  1.18 1.14  CALCIUM 9.2  --   --  8.8*  MG  --   --   --  1.8   GFR: Estimated Creatinine Clearance: 139.2 mL/min (by C-G formula based on SCr of 1.14 mg/dL). Recent Labs  Lab 03/10/22 1912 03/10/22 2311 03/11/22 0205   PROCALCITON  --  0.20 0.17  WBC 7.0 7.0 7.1  LATICACIDVEN 1.7 1.3 1.8   Liver Function Tests: Recent Labs  Lab 03/10/22 1912 03/11/22 0205  AST 37 41  ALT 44 49*  ALKPHOS 79 71  BILITOT 0.6 0.8  PROT 7.6 6.9  ALBUMIN 4.3 3.5   No results for input(s): "LIPASE", "AMYLASE" in the last 168 hours. Recent Labs  Lab 03/10/22 2311  AMMONIA 42*   ABG    Component Value Date/Time   PHART 7.355 03/10/2022 2253   PCO2ART 47.0 03/10/2022 2253   PO2ART 251 (H) 03/10/2022 2253   HCO3 26.2 03/10/2022 2253   TCO2 28 03/10/2022 2253   O2SAT 100 03/10/2022 2253    Coagulation Profile: No results for  input(s): "INR", "PROTIME" in the last 168 hours. Cardiac Enzymes: No results for input(s): "CKTOTAL", "CKMB", "CKMBINDEX", "TROPONINI" in the last 168 hours. HbA1C: Hgb A1c MFr Bld  Date/Time Value Ref Range Status  03/11/2022 02:05 AM 5.7 (H) 4.8 - 5.6 % Final    Comment:    (NOTE) Pre diabetes:          5.7%-6.4%  Diabetes:              >6.4%  Glycemic control for   <7.0% adults with diabetes    CBG: Recent Labs  Lab 03/10/22 2235 03/11/22 0319 03/11/22 0538  GLUCAP 159* 179* 170*   Past Medical History:  He,  has a past medical history of Anxiety, Depression, and Seizures ([place]).  Surgical History:  No past surgical history on file.  Social History:   reports that he has never smoked. He has never used smokeless tobacco. He reports that he does not currently use alcohol after a past usage of about 1.0 - 2.0 standard drink of alcohol per week. He reports that he does not currently use drugs after having used the following drugs: Marijuana.  Family History:  His family history includes Cancer in his maternal aunt; Diabetes in his father, maternal grandmother, mother, and paternal grandmother; Heart attack in his maternal grandmother.  Allergies No Known Allergies  Home Medications  Prior to Admission medications   Medication Sig Start Date End Date Taking?  Authorizing Provider  dicyclomine (BENTYL) 20 MG tablet Take 1 tablet (20 mg total) by mouth 2 (two) times daily for 5 days. 04/29/21 05/04/21  Jeanell Sparrow, DO  doxycycline (VIBRAMYCIN) 100 MG capsule Take 1 capsule (100 mg total) by mouth 2 (two) times daily. 07/12/45   Campbell Stall P, DO  erythromycin ophthalmic ointment Place a 1/2 inch ribbon of ointment into the lower eyelid up to 6 times a day for one week. 12/30/21   Tegeler, Gwenyth Allegra, MD  lamoTRIgine (LAMICTAL) 25 MG tablet Take 25 mg by mouth daily. 09/28/21   [provider]  naproxen (NAPROSYN) 500 MG tablet Take 1 tablet (500 mg total) by mouth 2 (two) times daily as needed. 09/24/21   Rayna Sexton, PA-C  sucralfate (CARAFATE) 1 g tablet Take 1 tablet (1 g total) by mouth 4 (four) times daily -  with meals and at bedtime for 7 days. 04/29/21 05/06/21  Jeanell Sparrow, DO  traZODone (DESYREL) 50 MG tablet Take 0.5-1 tablets (25-50 mg total) by mouth at bedtime as needed for sleep. 01/12/21   Montel Culver, MD  Vitamin D, Ergocalciferol, (DRISDOL) 1.25 MG (50000 UNIT) CAPS capsule Take 1 capsule (50,000 Units total) by mouth every 7 (seven) days. Take for 8 total doses(weeks) 01/29/21   Montel Culver, MD    Idamae Schuller, MD Tillie Rung. [facility], PGY-2

## 2022-03-11 NOTE — Progress Notes (Signed)
eLink Physician-Brief Progress Note Patient Name: Craig Carlson DOB: 03/05/2001 MRN: [id number]   Date of Service  03/11/2022  HPI/Events of Note  Patient with suspected pseudo-seizures, cEEG button was pushed and no seizures observed on remote monitoring.  eICU Interventions  Patient's arm placed over his face and let go, and was guided away from his face and returned to his side by patient. Will continue to monitor and activate seizure button for correlation with cEEG.        Kerry Kass Tyberius Ryner 03/11/2022, 11:46 PM

## 2022-03-11 NOTE — Procedures (Signed)
L3/L4 LP performed. Please see full dictation under imaging tab in Epic.  [name], [place] [phone] 03/11/2022, 10:53 AM

## 2022-03-11 NOTE — Consult Note (Signed)
Neurology Consultation Reason for Consult: Seizures Referring Physician: Suzette Battiest  CC: Seizures  History is obtained from: Chart review  HPI: Craig Carlson is a 21 y.o. male with a history of seizures who is supposed be taking [place], but is reportedly noncompliant.  He was treated 2 weeks ago with chlamydia was taking doxycycline.  Over the past 4 days he has had headaches, fever, neck/back discomfort and body aches.  He presented to the [facility] ED where he was febrile to 102, and then had multiple seizures despite multiple rounds of treatment.  He was given Ativan repeatedly, loaded with Keppra 4.5g(report indicated he got it, but it is not documented in the [facility]).  He reportedly continued having seizures after Keppra and multiple rounds of Ativan, and therefore was intubated and started on propofol.   Past Medical History:  Diagnosis Date   Anxiety    Depression    Seizures ([place])      Family History  Problem Relation Age of Onset   Diabetes Mother    Diabetes Father    Cancer Maternal Aunt    Diabetes Maternal Grandmother    Heart attack Maternal Grandmother    Diabetes Paternal Grandmother      Social History:  reports that he has never smoked. He has never used smokeless tobacco. He reports that he does not currently use alcohol after a past usage of about 1.0 - 2.0 standard drink of alcohol per week. He reports that he does not currently use drugs after having used the following drugs: Marijuana.   Exam: Current vital signs: BP 127/66   Pulse 94   Temp 100.3 F (37.9 C) (Rectal)   Resp 16   Ht 6\' 1"  (1.854 m)   Wt 116.1 kg   SpO2 97%   BMI 33.78 kg/m  Vital signs in last 24 hours: Temp:  [100.3 F (37.9 C)-102.3 F (39.1 C)] 100.3 F (37.9 C) (10/08 2159) Pulse Rate:  [94-139] 94 (10/08 2218) Resp:  [16-32] 16 (10/08 2218) BP: (125-143)/(66-82) 127/66 (10/08 2135) SpO2:  [97 %-100 %] 97 % (10/08 2218) FiO2 (%):  [28 %-100 %] 50 % (10/08  2255) Weight:  [116.1 kg] 116.1 kg (10/08 1844)   Physical Exam  Constitutional: Appears well-developed and well-nourished.  Psych: Sedated Eyes: No scleral injection HENT: Intubated  Neuro: (Sedated on 60 mg/kg/min of propofol) Mental Status:  Cranial Nerves: II: Does not blink to threat. Pupils are equal, round, and reactive to light.   III,IV, VI: Doll's intact V:VII: Blinks to eyelid stimulation bilaterally Motor: He withdraws to noxious stimulation in all four extremities Sensory: As above  DTR's depressed, no clonus Cerebellar: Does not perform     I have reviewed labs in epic and the results pertinent to this consultation are: Procalcitonin-pending  I have reviewed the images obtained: CT head-negative  Impression: 21 year old male with a history of seizures who presents with several days of worsening malaise, headaches, back pain and body aches.  It is possible that this simply represents breakthrough seizure in the setting of an underlying viral illness, but I do think meningitis has to be considered with this description.  An LP was attempted at med center, but was unsuccessful.  He is pending lumbar puncture under fluoroscopy.  He was still seizing despite being loaded with Keppra, and is currently being managed on propofol.  I would favor adding a second agent prior to trying to wean this.  Recommendations: 1) agree with empiric CNS coverage pending  LP 2) stat cerebell EEG to rule out ongoing seizure 3) fosphenytoin 20/kg load, followed by phenytoin 100 mg 3 times daily 4) transition cerebell to LTM EEG in the morning, would favor weaning propofol once this agrees no ongoing seizure.  This patient is critically ill and at significant risk of neurological worsening, death and care requires constant monitoring of vital signs, hemodynamics,respiratory and cardiac monitoring, neurological assessment, discussion with family, other specialists and medical decision  making of high complexity. I spent 55 minutes of neurocritical care time  in the care of  this patient. This was time spent independent of any time provided by nurse practitioner or PA.  Ritta Slot, MD [facility] [phone]  If 7pm- 7am, please page neurology on call as listed in AMION. 03/11/2022  12:21 AM

## 2022-03-11 NOTE — Progress Notes (Signed)
LTM EEG hooked up and running - no initial skin breakdown - push button tested - neuro notified. [facility] monitoring.  

## 2022-03-11 NOTE — Procedures (Addendum)
Patient Name: Craig Carlson  MRN: [id number]  Epilepsy Attending: Lora Havens  Referring Physician/Provider: Greta Doom, MD Duration: 03/10/2022 2319 to 10/9/202023 2505  Patient history: 21 year old male with a history of seizures who presents with several days of worsening malaise, headaches, back pain, body aches and seizures. EEG to evaluate for seizure  Level of alertness: comatose  AEDs during EEG study: LEV, Propofol  Technical aspects: This EEG was obtained using a 10 lead EEG system positioned circumferentially without any parasagittal coverage (rapid EEG). Computer selected EEG is reviewed as  well as background features and all clinically significant events.  Description: EEG showed continuous generalized 3 to 6 Hz theta-delta slowing admixed with an excessive amount of 15 to 18 Hz beta activity distributed symmetrically and diffusely. Hyperventilation and photic stimulation were not performed.     ABNORMALITY - Continuous slow, generalized - Excessive beta, generalized  IMPRESSION: This limited ceribell EEG  is suggestive of severe diffuse encephalopathy, nonspecific etiology but likely related to sedation. No seizures or epileptiform discharges were seen throughout the recording.  Jazir Newey [name]

## 2022-03-11 NOTE — Progress Notes (Addendum)
Subjective: No seizures overnight  ROS: Unable to obtain due to poor mental status  Examination  Vital signs in last 24 hours: Temp:  [97.3 F (36.3 C)-102.3 F (39.1 C)] 97.7 F (36.5 C) (10/09 0730) Pulse Rate:  [81-139] 92 (10/09 0810) Resp:  [0-32] 18 (10/09 0810) BP: (99-143)/(51-82) 122/65 (10/09 0730) SpO2:  [94 %-100 %] 99 % (10/09 0810) FiO2 (%):  [28 %-100 %] 40 % (10/09 0810) Weight:  [116.1 kg-118.1 kg] 118.1 kg (10/09 0246)  General: lying in bed, intubated Neuro: on Propofol @40 , comatose, does not open eyes to noxious stimuli, pupils miotic and difficult to appreciate any reactivity, corneal reflex present, gag reflex absent, does not withdraw to noxious stimuli however per RN was moving all 4 extremities and trying to remove ETT before lumbar puncture  Basic Metabolic Panel: Recent Labs  Lab 03/10/22 1912 03/10/22 2253 03/10/22 2311 03/11/22 0205  NA 139 138  --  137  K 4.1 3.9  --  4.4  CL 102  --   --  107  CO2 26  --   --  21*  GLUCOSE 82  --   --  159*  BUN 8  --   --  8  CREATININE 1.20  --  1.18 1.14  CALCIUM 9.2  --   --  8.8*  MG  --   --   --  1.8   CBC: Recent Labs  Lab 03/10/22 1912 03/10/22 2253 03/10/22 2311 03/11/22 0205  WBC 7.0  --  7.0 7.1  NEUTROABS 3.0  --   --   --   HGB 16.4 15.3 15.3 15.7  HCT 48.9 45.0 45.3 46.9  MCV 83.3  --  83.9 83.6  PLT 203  --  146* 213   Coagulation Studies: No results for input(s): "LABPROT", "INR" in the last 72 hours.  Imaging CTH wo contrast 03/11/2022: No acute intracranial abnormality.  ASSESSMENT AND PLAN: 21 year old male with a history of seizures who presents with several days of worsening malaise, headaches, back pain and body aches. It is possible that this simply represents breakthrough seizure in the setting of an underlying viral illness, but I do think meningitis has to be considered with this description.   Epilepsy with breakthrough seizure -Could have had breakthrough seizures  due to infection -Of note, per review of Dr. Sherilyn Cooter note from [facility], patient's episode of left-sided jerking were recorded without concomitant EEG change and were felt to be nonepileptic.  His recommendation was to wean off of Keppra and refer to psychiatry.  However, based at bedside the episode she witnessed was described as left upper extremity flexor posturing and right hand posturing followed by whole body jerking which is concerning for seizure.  We will try to obtain more history about epilepsy/seizures once patient is off sedation and extubated   Recommendations -Had lumbar puncture with IR today, will follow-up on results -We will plan to wean propofol at 10 mcg/h to stop after lumbar puncture -Will hold phenytoin to see if patient has any epileptiform abnormalities after we wean sedation.  -Avoid using Ativan unless definite changes on EEG -Continue antibiotics for now -Continue seizure precautions -Discussed plan with ICU team, fianc and sister at bedside  CRITICAL CARE Performed by: Lora Havens   Total critical care time: 45 minutes  Critical care time was exclusive of separately billable procedures and treating other patients.  Critical care was necessary to treat or prevent imminent or life-threatening deterioration.  Critical care was  time spent personally by me on the following activities: development of treatment plan with patient and/or surrogate as well as nursing, discussions with consultants, evaluation of patient's response to treatment, examination of patient, obtaining history from patient or surrogate, ordering and performing treatments and interventions, ordering and review of laboratory studies, ordering and review of radiographic studies, pulse oximetry and re-evaluation of patient's condition.     Zeb Comfort Epilepsy [facility] For questions after 5pm please refer to AMION to reach the Neurologist on call

## 2022-03-12 LAB — URINE CULTURE: Culture: 10000 — AB

## 2022-03-12 LAB — BASIC METABOLIC PANEL
Anion gap: 9 (ref 5–15)
BUN: 10 mg/dL (ref 6–20)
CO2: 22 mmol/L (ref 22–32)
Calcium: 8.3 mg/dL — ABNORMAL LOW (ref 8.9–10.3)
Chloride: 109 mmol/L (ref 98–111)
Creatinine, Ser: 1.16 mg/dL (ref 0.61–1.24)
GFR, Estimated: >60 mL/min (ref >60)
Glucose, Bld: 91 mg/dL (ref 70–99)
Sodium: 140 mmol/L (ref 135–145)

## 2022-03-12 LAB — GLUCOSE, CAPILLARY
Glucose-Capillary: 108 mg/dL — ABNORMAL HIGH (ref 70–99)
Glucose-Capillary: 99 mg/dL (ref 70–99)

## 2022-03-12 LAB — LEVETIRACETAM LEVEL: Levetiracetam Lvl: 34.9 ug/mL (ref 10.0–40.0)

## 2022-03-12 MED ORDER — POLYETHYLENE GLYCOL 3350 17 G PO PACK: 17.0000 g | PACK | Freq: Every day | ORAL | Status: DC

## 2022-03-12 MED ORDER — DOCUSATE SODIUM 50 MG/5ML PO LIQD: 100.0000 mg | Freq: Two times a day (BID) | ORAL | Status: DC

## 2022-03-12 MED ORDER — ACETAMINOPHEN 325 MG PO TABS: 650.0000 mg | ORAL_TABLET | ORAL | Status: DC | PRN

## 2022-03-12 NOTE — Progress Notes (Signed)
Physical Therapy Treatment Patient Details Name: Craig Carlson MRN: [id number] DOB: 05-Jan-2001 Today's Date: 03/12/2022   History of Present Illness pt is a 21 y/o male presenting to [facility] ED on 10/8 with fever and malaise.  Reports last 4 days complaints of HA, fever, neck/back discomfort and body aches.  pt's family reports patient is noncompliant with the lamictal for seizures and has had seizure.  Work up ongoing for Acute encephalopathy, concern for meningitis/encephalitis.  Continuous EEG study ongoing.  PMHx seizure, depression.    PT Comments    Pt admitted with above diagnosis. Pt was able to stand at EOB and took a few steps to [facility] as well. Limited by lines of EEG.  No LOB with what PT could do. Pt wants to go home.  Should progress well and not need f/u or equipment.  Will follow acutely.  Pt currently with functional limitations due to balance and endurance deficits. Pt will benefit from skilled PT to increase their independence and safety with mobility to allow discharge to the venue listed below.      Recommendations for follow up therapy are one component of a multi-disciplinary discharge planning process, led by the attending physician.  Recommendations may be updated based on patient status, additional functional criteria and insurance authorization.  Follow Up Recommendations  No PT follow up (if pt clears neurologically as expected.)     Assistance Recommended at Discharge Set up Supervision/Assistance  Patient can return home with the following Assist for transportation   Equipment Recommendations  None recommended by PT    Recommendations for Other Services       Precautions / Restrictions Precautions Precautions: Other (comment) (seizure) Restrictions Weight Bearing Restrictions: No     Mobility  Bed Mobility Overal bed mobility: Modified Independent             General bed mobility comments: rolling and scooting around in bed.     Transfers Overall transfer level: Needs assistance Equipment used: 1 person hand held assist Transfers: Sit to/from Stand Sit to Stand: Min guard, Supervision           General transfer comment: Pt was able to stand at EOB for up to 2 minutes. Pt took a few steps to [facility] as well.  No LOB and pt stated he felt good standing up. Pt limited by EEG lines.    Ambulation/Gait                   Stairs             Wheelchair Mobility    Modified Rankin (Stroke Patients Only)       Balance Overall balance assessment: Needs assistance Sitting-balance support: No upper extremity supported, Feet supported Sitting balance-Leahy Scale: Fair     Standing balance support: Single extremity supported, No upper extremity supported, During functional activity Standing balance-Leahy Scale: Fair Standing balance comment: Good balance standing and marching in place.  Limted by lines                            Cognition Arousal/Alertness: Awake/alert Behavior During Therapy: WFL for tasks assessed/performed Overall Cognitive Status: Within Functional Limits for tasks assessed                                          Exercises General Exercises -  Lower Extremity Long Arc Quad: AROM, Both, 5 reps, Seated Hip Flexion/Marching: AROM, Both, 5 reps, Standing    General Comments General comments (skin integrity, edema, etc.): VSS      Pertinent Vitals/Pain Pain Assessment Pain Assessment: No/denies pain Faces Pain Scale: No hurt    Home Living                          Prior Function            PT Goals (current goals can now be found in the care plan section) Acute Rehab PT Goals Patient Stated Goal: out of here Progress towards PT goals: Progressing toward goals    Frequency    Min 3X/week      PT Plan Current plan remains appropriate    Co-evaluation              AM-PAC PT "6 Clicks" Mobility   Outcome  Measure  Help needed turning from your back to your side while in a flat bed without using bedrails?: A Little Help needed moving from lying on your back to sitting on the side of a flat bed without using bedrails?: A Little Help needed moving to and from a bed to a chair (including a wheelchair)?: A Little Help needed standing up from a chair using your arms (e.g., wheelchair or bedside chair)?: A Little Help needed to walk in hospital room?: A Little Help needed climbing 3-5 steps with a railing? : A Little 6 Click Score: 18    End of Session Equipment Utilized During Treatment: Gait belt Activity Tolerance: Patient tolerated treatment well Patient left: in bed;with call bell/phone within reach;with family/visitor present Nurse Communication: Mobility status (left O2 off as sats >90%; pt asking for neuro MD) PT Visit Diagnosis: Other abnormalities of gait and mobility (R26.89);Other symptoms and signs involving the nervous system (R29.898)     Time: 1014-1030 PT Time Calculation (min) (ACUTE ONLY): 16 min  Charges:  $Therapeutic Activity: 8-22 mins                     [facility] M,PT Acute Rehab Services [place] 03/12/2022, 11:46 AM

## 2022-03-12 NOTE — Procedures (Signed)
Patient Name: Craig Carlson  MRN: [id number]  Epilepsy Attending: Lora Havens  Referring Physician/Provider: Lora Havens, Duration: 03/11/2022 1093 to 03/12/2022 2355  Patient history:  21 year old male with a history of seizures who presents with several days of worsening malaise, headaches, back pain, body aches and seizures. EEG to evaluate for seizure  Level of alertness: Awake, asleep  AEDs during EEG study: propofol, PHT  Technical aspects: This EEG study was done with scalp electrodes positioned according to the 10-20 International system of electrode placement. Electrical activity was reviewed with band pass filter of 1-70Hz , sensitivity of 7 uV/mm, display speed of 9mm/sec with a 60Hz  notched filter applied as appropriate. EEG data were recorded continuously and digitally stored.  Video monitoring was available and reviewed as appropriate.  Description: At the beginning of the study, EEG showed continuous generalized 3 to 6 Hz theta-delta slowing admixed with an excessive amount of 15 to 18 Hz beta activity distributed symmetrically and diffusely.  As sedation was weaned, EEG showed posterior dominant rhythm consists of 9-10 Hz activity of moderate voltage (25-35 uV) seen predominantly in posterior head regions, symmetric and reactive to eye opening and eye closing. Sleep was characterized by vertex waves, sleep spindles (12 to 14 Hz), maximal frontocentral region.  There is an excessive amount of 15 to 18 Hz, 2-3 uV beta activity with irregular morphology distributed symmetrically and diffusely.   Patient event button was pressed on 03/11/2022 at [id number] 2034 and 2324 during which patient was noted to have nonrhythmic flailing and tremor-like movements of whole body which would stop for few minutes and then restart again.  After the end of the event, patient would wake up and immediately stopped following commands.  Concomitant EEG before, during and after the event  showed normal posterior dominant rhythm.  Hyperventilation and photic stimulation were not performed.     ABNORMALITY - Excessive beta, generalized  IMPRESSION: This study is within normal limits. No seizures or epileptiform discharges were seen throughout the recording.  The excessive beta activity is most likely due to benzodiazepines and is a benign EEG pattern.  Multiple events were recorded on 03/11/2022 as described above without concomitant EEG change.  These episodes for NON-epileptic.   Derius Ghosh [name]

## 2022-03-12 NOTE — Progress Notes (Addendum)
Subjective: Patient states he is feeling back to baseline.  Denies taking any medications at home.  I discussed about potentially starting lamotrigine.  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  ROS: negative except above  Examination  Vital signs in last 24 hours: Temp:  [97.3 F (36.3 C)-99 F (37.2 C)] 98 F (36.7 C) (10/10 0844) Pulse Rate:  [80-168] 168 (10/10 0800) Resp:  [0-30] 20 (10/10 0800) BP: (81-143)/(21-84) 112/54 (10/10 0800) SpO2:  [93 %-100 %] 99 % (10/10 0700)  General: Sitting in bed, NAD Neuro: MS: Alert, oriented, follows commands CN: pupils equal and reactive,  EOMI, face symmetric Motor: Spontaneously moving all 4 extremities in bed  Basic Metabolic Panel: Recent Labs  Lab 03/10/22 1912 03/10/22 2253 03/10/22 2311 03/11/22 0205 03/12/22 0626  NA 139 138  --  137 140  K 4.1 3.9  --  4.4 SPECIMEN HEMOLYZED. HEMOLYSIS MAY AFFECT INTEGRITY OF RESULTS.  CL 102  --   --  107 109  CO2 26  --   --  21* 22  GLUCOSE 82  --   --  159* 91  BUN 8  --   --  8 10  CREATININE 1.20  --  1.18 1.14 1.16  CALCIUM 9.2  --   --  8.8* 8.3*  MG  --   --   --  1.8  --     CBC: Recent Labs  Lab 03/10/22 1912 03/10/22 2253 03/10/22 2311 03/11/22 0205  WBC 7.0  --  7.0 7.1  NEUTROABS 3.0  --   --   --   HGB 16.4 15.3 15.3 15.7  HCT 48.9 45.0 45.3 46.9  MCV 83.3  --  83.9 83.6  PLT 203  --  146* 213     Coagulation Studies: No results for input(s): "LABPROT", "INR" in the last 72 hours.  Imaging No new brain imaging overnight  ASSESSMENT AND PLAN: 21 year old male with a history of seizures who presents with several days of worsening malaise, headaches, back pain and body aches. It is possible that this simply represents breakthrough seizure in the setting of an underlying viral illness, but I do think meningitis has to be considered with this description.     Convulsions Fever -Could have had breakthrough seizures due to infection -Of note, per review of Dr. Paulita Fujita note from [facility], patient's episode of left-sided jerking were recorded without concomitant EEG change and were felt to be nonepileptic.  His recommendation was to wean off of Keppra and refer to psychiatry.  However, based at bedside the episode she witnessed was described as left upper extremity flexor posturing and right hand posturing followed by whole body jerking which is concerning for seizure.  We did record multiple nonepileptic events.  No ictal-interictal activity was noted during overnight LTM.   Recommendations -I did is about potentially starting lamotrigine as it can help with nonepileptic events as well.  However, patient is adamantly refusing any medications.  Patient's girlfriend was in the room and discussed sending prescription in case patient changes his mind.  Patient declined this as well.  I explained to patient that he is welcome to see [place] in clinic or call us if he would like a prescription -Also discussed seizure precautions including do not drive for 6 months per [facility]. -Would have obtained an MRI brain but patient is requesting to leave right  away and has not planned any further work-up.  He states he has been dealing with seizures since he was 19 and therefore familiar with all the risks and benefits. -We will DC LTM EEG. -Defer further work-up to primary team  Seizure precautions: Per [facility] statutes, patients with seizures are not allowed to drive until they have been seizure-free for six months and cleared by a physician    Use caution when using heavy equipment or power tools. Avoid working on ladders or at heights. Take showers instead of baths. Ensure the water temperature is not too high on the home water heater. Do not go swimming alone. Do not lock yourself in a room alone (i.e. bathroom). When caring for infants or small  children, sit down when holding, feeding, or changing them to minimize risk of injury to the child in the event you have a seizure. Maintain good sleep hygiene. Avoid alcohol.    If patient has another seizure, call 911 and bring them back to the ED if: A.  The seizure lasts longer than 5 minutes.      B.  The patient doesn't wake shortly after the seizure or has new problems such as difficulty seeing, speaking or moving following the seizure C.  The patient was injured during the seizure D.  The patient has a temperature over 102 F (39C) E.  The patient vomited during the seizure and now is having trouble breathing    During the Seizure   - First, ensure adequate ventilation and place patients on the floor on their left side  Loosen clothing around the neck and ensure the airway is patent. If the patient is clenching the teeth, do not force the mouth open with any object as this can cause severe damage - Remove all items from the surrounding that can be hazardous. The patient may be oblivious to what's happening and may not even know what he or she is doing. If the patient is confused and wandering, either gently guide him/her away and block access to outside areas - Reassure the individual and be comforting - Call 911. In most cases, the seizure ends before EMS arrives. However, there are cases when seizures may last over 3 to 5 minutes. Or the individual may have developed breathing difficulties or severe injuries. If a pregnant patient or a person with diabetes develops a seizure, it is prudent to call an ambulance. - Finally, if the patient does not regain full consciousness, then call EMS. Most patients will remain confused for about 45 to 90 minutes after a seizure, so you must use judgment in calling for help.    After the Seizure (Postictal Stage)   After a seizure, most patients experience confusion, fatigue, muscle pain and/or a headache. Thus, one should permit the individual to  sleep. For the next few days, reassurance is essential. Being calm and helping reorient the person is also of importance.   Most seizures are painless and end spontaneously. Seizures are not harmful to others but can lead to complications such as stress on the lungs, brain and the heart. Individuals with prior lung problems may develop labored breathing and respiratory distress.   I have spent a total of 38 minutes with the patient reviewing hospital notes,  test results, labs and examining the patient as well as establishing an assessment and plan that was discussed personally with the patient.  > 50% of time was spent in direct patient care.   Zeb Comfort Epilepsy  [facility] For questions after 5pm please refer to AMION to reach the Neurologist on call

## 2022-03-12 NOTE — Procedures (Signed)
Patient Name: Craig Carlson  MRN: [id number]  Epilepsy Attending: Lora Havens  Referring Physician/Provider: Lora Havens, Duration: 03/12/2022 4235 to 03/12/2022 1226   Patient history:  21 year old male with a history of seizures who presents with several days of worsening malaise, headaches, back pain, body aches and seizures. EEG to evaluate for seizure   Level of alertness: Awake, asleep   AEDs during EEG study: propofol, PHT   Technical aspects: This EEG study was done with scalp electrodes positioned according to the 10-20 International system of electrode placement. Electrical activity was reviewed with band pass filter of 1-70Hz , sensitivity of 7 uV/mm, display speed of 38mm/sec with a 60Hz  notched filter applied as appropriate. EEG data were recorded continuously and digitally stored.  Video monitoring was available and reviewed as appropriate.   Description: The posterior dominant rhythm consists of 9-10 Hz activity of moderate voltage (25-35 uV) seen predominantly in posterior head regions, symmetric and reactive to eye opening and eye closing. Hyperventilation and photic stimulation were not performed.       IMPRESSION: This study is within normal limits. No seizures or epileptiform discharges were seen throughout the recording.    Please note, lack of epileptiform activity does not exclude the diagnosis of epilepsy.   Craig Carlson [name]

## 2022-03-12 NOTE — Discharge Summary (Signed)
Name: Lucero Ide [facility] MRN: [id number] DOB: [birth year] 21 y.o. PCP: Pcp, No  Date of Admission: 03/10/2022  6:38 PM Date of Discharge: 03/12/22 Attending Physician: Candee Furbish, MD  Discharge Diagnosis: Principal Problem:   Status epilepticus [facility]) Active Problems:   Seizure ([place])   Acute respiratory failure ([place])   Acute encephalopathy   Discharge Medications: Allergies as of 03/12/2022   No Known Allergies      Medication List     STOP taking these medications    doxycycline 100 MG capsule Commonly known as: VIBRAMYCIN   erythromycin ophthalmic ointment       TAKE these medications    acetaminophen 325 MG tablet Commonly known as: TYLENOL Take 2 tablets (650 mg total) by mouth every 4 (four) hours as needed for mild pain (temp > 101.5).        Disposition and follow-up:   Mr.Jaxxen Sevrin Sally was discharged from [facility] in Stable condition.  At the hospital follow up visit please address: 1) Seizures; Appears pt was following neurologist outpatient. Ensure pt is on proper the indicated regimen. He declined starting any medications by our neurology team. 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  2) Anxiety/Depression: Start medication if indicated.  3) Diabetes: Ensure pt is adhering to lifestyle modifications.   1.  Labs / imaging needed at time of follow-up: CBC, BMP  2.  Pending labs/ test needing follow-up: GC/Chlamydia, Blood Cultures, Levetiracetam level, Fungus Cultures  Follow-up Appointments: Patient to schedule PCP appointment.   Hospital Course by problem list: HPI on Admission "Patient is a 21 year old male with pertinent PMH of seizures on Lamictal, STDs (gonorrhea/chlamydia) presents to [place] ED on 10/8 with fever and malaise.   Patient states about 2 weeks ago he was being treated for chlamydia and has finished taking his Doxy.  Denies any urine  discharge.  Over the past 4 days patient has been complaining of headaches, fever, neck/back discomfort, and body aches.  Patient denies any SOB, cough, abdominal/chest pain.  Of note, family states that patient is noncompliant with Lamictal for seizures and has them fairly frequently.  Came to [place] ED for further eval on 10/8.   Patient fever 102 F.  BP and respiratory status stable in NAD.  HR 116.  Patient initially AOx3.  No meningismus based on exam.  While awaiting labs patient began having episode of seizure.  Given 2 Mg of Ativan with no improvement in seizure.  Loaded with Keppra.  Patient altered and required intubation for airway protection.  CT head with no acute abnormality.  LP pending.  Neuro consulted.   Patient being transferred to [facility] for further eval.  PCCM consulted.   Pertinent ED labs: CXR showing normal lungs with ETT 3.9 cm above carina. UA WNL; UC pending.  BC x2 pending.  CMP/CBC WNL.  LA 1.7.  COVID/flu negative. GC/chlamydia pending."  #Seizure disorder c/b status epilepticus #Acute Encephalopathy #Concern for meningitis/encephalitis Pt has hx of seizures and on lamictal at home and non-complaint per family. Pt had 4 days of feeling unwell prior to presentation per sister. Pt was complaining of headache, neck pain and having nausea and vomiting along with fevers. Pt has seizures about twice per month and misses his medications. Appears pt could have infectious etiology to his status epilepticus with the symptoms prior to presentation. Patient was intubated due to concern for airway protection. He was empirically started on Rocephin 2  g BID, Vanc 1 g q8hrs, and acyclovir 950 mg q8hrs for empiric meningitis coverage. He was given Dilantin and Keppra. LT EEG was performed. He was also started on Decadron. LP done yesterday and does not appear consistent with infectious etiology. CSF without organism, appearance clear, slightly elevated RBC at 64 and WBC at 7 and protein 47.  Bio-fire negative for organisms as well. Keppra level pending. All anti-epileptics, abx, and steroids discontinued. Pt was advised to stay overnight given the initial fevers but wanted to leave. Neurology saw patient pt stated he wanted to leave and refused starting any medications. 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 Patient signed AMA paperwork and left.   #Hx of STD GC/Chlamydia pending. Was positive in 01/2022 and pt treated with doxycline. Unsure if pt completed regimen. On chart review pt has multiple tests of GC/Chlamydia suggestive of at risk behavior.    #Hx of Anxiety/Depression Patient has anxiety and depression. I wanted to discuss options for anxiety and depression but patient left AMA.   #Prediabetes A1c slightly elevated at 5.7 suggestive of prediabetes. Counseled on life style modifications.    Discharge Exam:   BP (!) 112/54   Pulse (!) 168   Temp 98 F (36.7 C) (Oral)   Resp 20   Ht 6\' 1"  (1.854 m)   Wt 118.1 kg   SpO2 99%   BMI 34.35 kg/m  General: NAD, laying comfortable in bed HENT: NCAT, EEG leads in place Lungs: normal work of breathing Cardiovascular: well perfused, with good pulses  Abdomen: No TTP Extremities: No asymmetry Neuro: alert and oriented x4 GU: no foley   Pertinent Labs, Studies, and Procedures:     Latest Ref Rng & Units 03/11/2022    2:05 AM 03/10/2022   11:11 PM 03/10/2022   10:53 PM  CBC  WBC 4.0 - 10.5 K/uL 7.1  7.0    Hemoglobin 13.0 - 17.0 g/dL 05/10/2022  38.7  56.4   Hematocrit 39.0 - 52.0 % 46.9  45.3  45.0   Platelets 150 - 400 K/uL 213  146         Latest Ref Rng & Units 03/12/2022    6:26 AM 03/11/2022    2:05 AM 03/10/2022   11:11 PM  CMP  Glucose 70 - 99 mg/dL 91  05/10/2022    BUN 6 - 20 mg/dL 10  8    Creatinine 951 - 1.24 mg/dL 8.84  1.66  0.63   Sodium 135 - 145 mmol/L 140  137    Potassium 3.5 - 5.1 mmol/L SPECIMEN HEMOLYZED. HEMOLYSIS MAY AFFECT INTEGRITY OF RESULTS.  4.4     Chloride 98 - 111 mmol/L 109  107    CO2 22 - 32 mmol/L 22  21    Calcium 8.9 - 10.3 mg/dL 8.3  8.8    Total Protein 6.5 - 8.1 g/dL  6.9    Total Bilirubin 0.3 - 1.2 mg/dL  0.8    Alkaline Phos 38 - 126 U/L  71    AST 15 - 41 U/L  41    ALT 0 - 44 U/L  49       DG FL GUIDED LUMBAR PUNCTURE  Result Date: 03/11/2022 CLINICAL DATA:  Patient admitted with seizures and fever. Concern for meningitis. Diagnostic lumbar puncture requested EXAM: DIAGNOSTIC LUMBAR PUNCTURE UNDER FLUOROSCOPIC GUIDANCE COMPARISON:  None Available. FLUOROSCOPY: Radiation Exposure Index (as provided by the fluoroscopic device): 18.20 mGy Kerma PROCEDURE:  Informed consent was obtained from the patient prior to the procedure, including potential complications of headache, allergy, and pain. With the patient prone, the lower back was prepped with Betadine. 1% Lidocaine was used for local anesthesia. Lumbar puncture was performed at the L3-L4 level using a 20 gauge needle with return of clear CSF with an opening pressure of 18 cm water. 10 ml of CSF were obtained for laboratory studies. The patient tolerated the procedure well and there were no apparent complications. IMPRESSION: Technically successful L3-L4 lumbar puncture yielding 10 mL of clear CSF for laboratory studies. Read by: Alwyn Ren, NP Electronically Signed   By: Leanna Battles M.D.   On: 03/11/2022 11:06   Rapid EEG  Result Date: 03/11/2022 Charlsie Quest, MD     03/11/2022  9:19 AM Patient Name: Dak Szumski [facility] MRN: [id number] Epilepsy Attending: Charlsie Quest Referring Physician/Provider: Rejeana Brock, MD Duration: 03/10/2022 2319 to 10/9/202023 8469 Patient history: 21 year old male with a history of seizures who presents with several days of worsening malaise, headaches, back pain, body aches and seizures. EEG to evaluate for seizure Level of alertness: comatose AEDs during EEG study: LEV, Propofol Technical aspects: This EEG was obtained  using a 10 lead EEG system positioned circumferentially without any parasagittal coverage (rapid EEG). Computer selected EEG is reviewed as  well as background features and all clinically significant events. Description: EEG showed continuous generalized 3 to 6 Hz theta-delta slowing admixed with an excessive amount of 15 to 18 Hz beta activity distributed symmetrically and diffusely. Hyperventilation and photic stimulation were not performed.   ABNORMALITY - Continuous slow, generalized - Excessive beta, generalized IMPRESSION: This limited ceribell EEG  is suggestive of severe diffuse encephalopathy, nonspecific etiology but likely related to sedation. No seizures or epileptiform discharges were seen throughout the recording. Charlsie Quest   DG CHEST PORT 1 VIEW  Result Date: 03/10/2022 CLINICAL DATA:  Respiratory failure EXAM: PORTABLE CHEST 1 VIEW COMPARISON:  8:52 p.m. FINDINGS: Endotracheal tube is seen 3.6 cm above the carina. Nasogastric tube extends in the upper abdomen beyond the margin of the examination. Lungs are clear. No pneumothorax or pleural effusion. Cardiac size within normal limits. Pulmonary vascularity is normal. IMPRESSION: 1. Support tubes in appropriate position. 2. No active disease. Electronically Signed   By: Helyn Numbers M.D.   On: 03/10/2022 22:48   CT Head Wo Contrast  Result Date: 03/10/2022 CLINICAL DATA:  Mental status change, unknown cause EXAM: CT HEAD WITHOUT CONTRAST TECHNIQUE: Contiguous axial images were obtained from the base of the skull through the vertex without intravenous contrast. RADIATION DOSE REDUCTION: This exam was performed according to the departmental dose-optimization program which includes automated exposure control, adjustment of the mA and/or kV according to patient size and/or use of iterative reconstruction technique. COMPARISON:  CT head 05/23/2021 FINDINGS: Brain: No evidence of large-territorial acute infarction. No parenchymal hemorrhage.  No mass lesion. No extra-axial collection. No mass effect or midline shift. No hydrocephalus. Basilar cisterns are patent. Vascular: No hyperdense vessel. Skull: No acute fracture or focal lesion. Sinuses/Orbits: Paranasal sinuses and mastoid air cells are clear. The orbits are unremarkable. Other: None. IMPRESSION: No acute intracranial abnormality. Electronically Signed   By: Tish Frederickson M.D.   On: 03/10/2022 21:34   DG Chest Portable 1 View  Result Date: 03/10/2022 CLINICAL DATA:  Intubation, tube placement EXAM: PORTABLE CHEST 1 VIEW COMPARISON:  Portable exam 2052 hours compared to 09/24/2021 FINDINGS: Tip of endotracheal tube projects 3.9 cm above  carina. Normal heart size, mediastinal contours, and pulmonary vascularity. RIGHT lateral costal margin excluded. Lungs clear. No pleural effusion or pneumothorax. IMPRESSION: No acute abnormalities. Electronically Signed   By: Ulyses Southward M.D.   On: 03/10/2022 21:03     Discharge Instructions: Discharge Instructions     Call MD for:  difficulty breathing, headache or visual disturbances   Complete by: As directed    Call MD for:  hives   Complete by: As directed    Call MD for:  persistant dizziness or light-headedness   Complete by: As directed    Call MD for:  persistant nausea and vomiting   Complete by: As directed    Call MD for:  redness, tenderness, or signs of infection (pain, swelling, redness, odor or green/yellow discharge around incision site)   Complete by: As directed    Call MD for:  severe uncontrolled pain   Complete by: As directed    Call MD for:  temperature >100.4   Complete by: As directed    Diet general   Complete by: As directed    Driving Restrictions   Complete by: As directed    No driving for 6 months and it cleared by your primary care provider.   Increase activity slowly   Complete by: As directed        Signed: Gwenevere Abbot, MD Eligha Bridegroom. [facility] Internal Medicine Residency, PGY-1   03/12/2022, 12:33 PM

## 2022-03-12 NOTE — Progress Notes (Signed)
NAME:  Craig Carlson, MRN:  [id number], DOB:  07-29-2000, LOS: 2 ADMISSION DATE:  03/10/2022, CONSULTATION DATE:  10/08 REFERRING MD:  EDP CHIEF COMPLAINT:  Status Epilepticus   History of Present Illness:  Patient is a 21 year old male with pertinent PMH of seizures on Lamictal, STDs (gonorrhea/chlamydia) presents to [place] ED on 10/8 with fever and malaise.   Patient states about 2 weeks ago he was being treated for chlamydia and has finished taking his Doxy.  Denies any urine discharge.  Over the past 4 days patient has been complaining of headaches, fever, neck/back discomfort, and body aches.  Patient denies any SOB, cough, abdominal/chest pain.  Of note, family states that patient is noncompliant with Lamictal for seizures and has them fairly frequently.  Came to [place] ED for further eval on 10/8.   Patient fever 102 F.  BP and respiratory status stable in NAD.  HR 116.  Patient initially AOx3.  No meningismus based on exam.  While awaiting labs patient began having episode of seizure.  Given 2 Mg of Ativan with no improvement in seizure.  Loaded with Keppra.  Patient altered and required intubation for airway protection. CT head with no acute abnormality.  LP pending.  Neuro consulted.   Patient being transferred to [facility] for further eval.  PCCM consulted.   Pertinent ED labs: CXR showing normal lungs with ETT 3.9 cm above carina. CBC wnl except mild erythrocytosis. CMP, UA, LA, COVID, Flu, group A strep WNL; Pro cal 0.20, UC pending.  BC x2 pending. GC/chlamydia pending. Alcohol and acetaminophen level <10. Mag 1.8.   Pertinent  Medical History  Anxiety, Depression, Seizures, hx of chlamydia, hx of gonorrhea Significant Hospital Events: Including procedures, antibiotic start and stop dates in addition to other pertinent events    Interim History / Subjective:  No acute events overnight. Some seizure activity noted but appears to be pseudoseizures. Pt very agitated as he  does not want to be in the hospital. States he wants to get discharged.  Review of Systems:   Negative unless stated in the HPI.  Objective   Blood pressure 120/65, pulse 99, temperature 98 F (36.7 C), temperature source Oral, resp. rate (!) 26, height 6\' 1"  (1.854 m), weight 118.1 kg, SpO2 98 %.    Vent Mode: PRVC FiO2 (%):  [40 %-50 %] 40 % Set Rate:  [18 bmp] 18 bmp Vt Set:  [570 mL] 570 mL PEEP:  [5 cmH20] 5 cmH20 Plateau Pressure:  [17 cmH20] 17 cmH20   Intake/Output Summary (Last 24 hours) at 03/12/2022 0640 Last data filed at 03/11/2022 2000 Gross per 24 hour  Intake 2129.96 ml  Output 2040 ml  Net 89.96 ml    Filed Weights   03/10/22 1844 03/11/22 0246  Weight: 116.1 kg 118.1 kg   Examination: General: NAD, laying comfortable in bed HENT: NCAT, EEG leads in place Lungs: normal work of breathing Cardiovascular: well perfused, with good pulses  Abdomen: No TTP Extremities: No asymmetry Neuro: alert and oriented x4 GU: no foley  Diagnostics CBC wnl. CMP with hypocalcemia at 8.8 and ALT elevated mildly at 49. LA normal. A1c 5.7. Keppra level pending. UDS pending. [place] and [place] pending. LP studies.  Consults  Neurology Resolved Hospital Problem list   Acute Respiratory Failure Assessment & Plan:  #Seizure disorder c/b status epilepticus #Acute Encephalopathy #Concern for meningitis/encephalitis LP done yesterday and does not appear consistent with infectious etiology. CSF without organism, appearance clear, slightly elevated RBC at 64  and WBC at 7 and protein 47. Bio-fire negative for organisms as well. Keppra level pending. All anti-epileptics, abx, and steroids dc. Will await final neuro recommendations for anti-epileptics.  -Follow up pending studies.   #Hx of STD GC/Chlamydia pending. Was positive in 01/2022 and pt treated with doxycline. Unsure if pt completed regimen. On chart review pt has multiple tests of GC/Chlamydia suggestive of at risk behavior.   #Hx  of Anxiety/Depression Restart home trazodone once able.   #Prediabetes A1c slightly elevated at 5.7 suggestive of prediabetes. Will counsel on lifestyle mods once acute illness subsides.   Best Practice (right click and "Reselect all SmartList Selections" daily)  Diet/type: Regular diet DVT prophylaxis: LMWH GI prophylaxis: PPI Lines: N/A Foley:  N/A Continuous: None Code Status:  full code Last date of multidisciplinary goals of care discussion [10/10] Labs   CBC: Recent Labs  Lab 03/10/22 1912 03/10/22 2253 03/10/22 2311 03/11/22 0205  WBC 7.0  --  7.0 7.1  NEUTROABS 3.0  --   --   --   HGB 16.4 15.3 15.3 15.7  HCT 48.9 45.0 45.3 46.9  MCV 83.3  --  83.9 83.6  PLT 203  --  146* 416    Basic Metabolic Panel: Recent Labs  Lab 03/10/22 1912 03/10/22 2253 03/10/22 2311 03/11/22 0205  NA 139 138  --  137  K 4.1 3.9  --  4.4  CL 102  --   --  107  CO2 26  --   --  21*  GLUCOSE 82  --   --  159*  BUN 8  --   --  8  CREATININE 1.20  --  1.18 1.14  CALCIUM 9.2  --   --  8.8*  MG  --   --   --  1.8    GFR: Estimated Creatinine Clearance: 139.2 mL/min (by C-G formula based on SCr of 1.14 mg/dL). Recent Labs  Lab 03/10/22 1912 03/10/22 2311 03/11/22 0205  PROCALCITON  --  0.20 0.17  WBC 7.0 7.0 7.1  LATICACIDVEN 1.7 1.3 1.8    Liver Function Tests: Recent Labs  Lab 03/10/22 1912 03/11/22 0205  AST 37 41  ALT 44 49*  ALKPHOS 79 71  BILITOT 0.6 0.8  PROT 7.6 6.9  ALBUMIN 4.3 3.5    No results for input(s): "LIPASE", "AMYLASE" in the last 168 hours. Recent Labs  Lab 03/10/22 2311  AMMONIA 42*    ABG    Component Value Date/Time   PHART 7.355 03/10/2022 2253   PCO2ART 47.0 03/10/2022 2253   PO2ART 251 (H) 03/10/2022 2253   HCO3 26.2 03/10/2022 2253   TCO2 28 03/10/2022 2253   O2SAT 100 03/10/2022 2253    Coagulation Profile: No results for input(s): "INR", "PROTIME" in the last 168 hours. Cardiac Enzymes: No results for input(s):  "CKTOTAL", "CKMB", "CKMBINDEX", "TROPONINI" in the last 168 hours. HbA1C: Hgb A1c MFr Bld  Date/Time Value Ref Range Status  03/11/2022 02:05 AM 5.7 (H) 4.8 - 5.6 % Final    Comment:    (NOTE) Pre diabetes:          5.7%-6.4%  Diabetes:              >6.4%  Glycemic control for   <7.0% adults with diabetes    CBG: Recent Labs  Lab 03/11/22 1108 03/11/22 1533 03/11/22 2018 03/11/22 2319 03/12/22 0324  GLUCAP 123* 124* 113* 124* 108*    Past Medical History:  He,  has a  past medical history of Anxiety, Depression, and Seizures ([place]).  Surgical History:  No past surgical history on file.  Social History:   reports that he has never smoked. He has never used smokeless tobacco. He reports that he does not currently use alcohol after a past usage of about 1.0 - 2.0 standard drink of alcohol per week. He reports that he does not currently use drugs after having used the following drugs: Marijuana.  Family History:  His family history includes Cancer in his maternal aunt; Diabetes in his father, maternal grandmother, mother, and paternal grandmother; Heart attack in his maternal grandmother.  Allergies No Known Allergies  Home Medications  Prior to Admission medications   Medication Sig Start Date End Date Taking? Authorizing Provider  dicyclomine (BENTYL) 20 MG tablet Take 1 tablet (20 mg total) by mouth 2 (two) times daily for 5 days. 04/29/21 05/04/21  Jeanell Sparrow, DO  doxycycline (VIBRAMYCIN) 100 MG capsule Take 1 capsule (100 mg total) by mouth 2 (two) times daily. [id number]   Campbell Stall P, DO  erythromycin ophthalmic ointment Place a 1/2 inch ribbon of ointment into the lower eyelid up to 6 times a day for one week. 12/30/21   Tegeler, Gwenyth Allegra, MD  lamoTRIgine (LAMICTAL) 25 MG tablet Take 25 mg by mouth daily. 09/28/21   [provider]  naproxen (NAPROSYN) 500 MG tablet Take 1 tablet (500 mg total) by mouth 2 (two) times daily as needed. 09/24/21   Rayna Sexton, PA-C  sucralfate (CARAFATE) 1 g tablet Take 1 tablet (1 g total) by mouth 4 (four) times daily -  with meals and at bedtime for 7 days. 04/29/21 05/06/21  Jeanell Sparrow, DO  traZODone (DESYREL) 50 MG tablet Take 0.5-1 tablets (25-50 mg total) by mouth at bedtime as needed for sleep. 01/12/21   Montel Culver, MD  Vitamin D, Ergocalciferol, (DRISDOL) 1.25 MG (50000 UNIT) CAPS capsule Take 1 capsule (50,000 Units total) by mouth every 7 (seven) days. Take for 8 total doses(weeks) 01/29/21   Montel Culver, MD    Idamae Schuller, MD Tillie Rung. [facility] Internal Medicine Residency, PGY-2

## 2022-03-12 NOTE — Progress Notes (Signed)
Patient left against medical advice, patient educated. MD. Tamala Julian was notify and is aware, , All IV remove and EEG leads remove.

## 2022-03-12 NOTE — Progress Notes (Signed)
Pt displaying seizure-like activity. EEG button pressed. Neurology on call notified. PRN med dose given. E-Link MD notified while awaiting instruction from neurology. Neurology states that this event appears to be pseudoseizures. PRN Med okay at this time. No further instructions given. Upon termination of the event, pt nods/gestures appropriately and follows commands, but will not answer questions at this time.

## 2022-03-13 ENCOUNTER — Other Ambulatory Visit: Payer: Self-pay

## 2022-03-13 ENCOUNTER — Emergency Department (HOSPITAL_COMMUNITY): Payer: Medicaid Other

## 2022-03-13 ENCOUNTER — Encounter (HOSPITAL_COMMUNITY): Payer: Self-pay

## 2022-03-13 ENCOUNTER — Emergency Department (HOSPITAL_COMMUNITY)
Admission: EM | Admit: 2022-03-13 | Discharge: 2022-03-13 | Discharge status: Against Medical Advice | Payer: Medicaid Other | Attending: Medical | Admitting: Medical

## 2022-03-13 DIAGNOSIS — R519 Headache, unspecified: Secondary | ICD-10-CM | POA: Diagnosis present

## 2022-03-13 DIAGNOSIS — Z5321 Procedure and treatment not carried out due to patient leaving prior to being seen by health care provider: Secondary | ICD-10-CM | POA: Diagnosis not present

## 2022-03-13 LAB — CBC WITH DIFFERENTIAL/PLATELET
Abs Immature Granulocytes: 0 10*3/uL (ref 0.00–0.07)
Basophils Absolute: 0 10*3/uL (ref 0.0–0.1)
Basophils Relative: 0 %
Eosinophils Absolute: 0.4 10*3/uL (ref 0.0–0.5)
Eosinophils Relative: 4 %
HCT: 43.5 % (ref 39.0–52.0)
Hemoglobin: 14.3 g/dL (ref 13.0–17.0)
Lymphocytes Relative: 53 %
Lymphs Abs: 5.1 10*3/uL — ABNORMAL HIGH (ref 0.7–4.0)
MCH: 27.8 pg (ref 26.0–34.0)
MCHC: 32.9 g/dL (ref 30.0–36.0)
MCV: 84.6 fL (ref 80.0–100.0)
Monocytes Absolute: 1.6 10*3/uL — ABNORMAL HIGH (ref 0.1–1.0)
Monocytes Relative: 17 %
Neutro Abs: 2.5 10*3/uL (ref 1.7–7.7)
Neutrophils Relative %: 26 %
Platelets: 219 10*3/uL (ref 150–400)
RBC: 5.14 MIL/uL (ref 4.22–5.81)
RDW: 12.8 % (ref 11.5–15.5)
WBC: 9.6 10*3/uL (ref 4.0–10.5)
nRBC: 0 % (ref 0.0–0.2)
nRBC: 0 /100 WBC

## 2022-03-13 LAB — BASIC METABOLIC PANEL
Anion gap: 9 (ref 5–15)
BUN: 8 mg/dL (ref 6–20)
CO2: 26 mmol/L (ref 22–32)
Calcium: 8.6 mg/dL — ABNORMAL LOW (ref 8.9–10.3)
Chloride: 106 mmol/L (ref 98–111)
Creatinine, Ser: 1.2 mg/dL (ref 0.61–1.24)
GFR, Estimated: >60 mL/min (ref >60)
Glucose, Bld: 99 mg/dL (ref 70–99)
Potassium: 3.6 mmol/L (ref 3.5–5.1)
Sodium: 141 mmol/L (ref 135–145)

## 2022-03-13 LAB — CYTOLOGY - NON PAP

## 2022-03-13 LAB — VDRL, CSF: VDRL Quant, CSF: NONREACTIVE

## 2022-03-13 MED ORDER — SODIUM CHLORIDE 0.9 % IV BOLUS: 1000.0000 mL | Freq: Once | INTRAVENOUS | Status: DC

## 2022-03-13 MED ORDER — GADOBUTROL 1 MMOL/ML IV SOLN
10.0000 mL | Freq: Once | INTRAVENOUS | Status: AC | PRN
  Administered 2022-03-13: 10 mL via INTRAVENOUS

## 2022-03-13 NOTE — ED Notes (Signed)
No answer for vitals  

## 2022-03-13 NOTE — ED Provider Triage Note (Signed)
Emergency Medicine Provider Triage Evaluation Note  Craig Carlson , a 21 y.o. male  was evaluated in triage.  Pt complains of headache.  Patient was admitted to ICU for encephalitis/meningitis left AMA yesterday as he had a new baby.  He denies any changes symptoms or any additional concerns.  Review of Systems  Positive: Headache Negative: Fever, vomiting, numbness  Physical Exam  BP 128/81 (BP Location: Left Arm)   Pulse 93   Temp 99.4 F (37.4 C) (Oral)   Resp 18   Ht 6\' 1"  (1.854 m)   Wt 117.9 kg   SpO2 97%   BMI 34.30 kg/m  Gen:   Awake, no distress   Resp:  Normal effort  MSK:   Moves extremities without difficulty  Other:  Cranial nerves grossly intact.  No meningeal signs.  Negative pronator drift.  Medical Decision Making  Medically screening exam initiated at 9:30 AM.  Appropriate orders placed.  Craig Carlson was informed that the remainder of the evaluation will be completed by another provider, this initial triage assessment does not replace that evaluation, and the importance of remaining in the ED until their evaluation is complete.    Note: Portions of this report may have been transcribed using voice recognition software. Every effort was made to ensure accuracy; however, inadvertent computerized transcription errors may still be present.    Deliah Boston, PA-C 03/13/22 [phone]

## 2022-03-13 NOTE — Plan of Care (Signed)
Discussed with PA Erlene Quan, and Dr. Hortense Ramal -- patient left AMA yesterday with work-up pending of MRI brain with and without contrast.  Today he is presenting to [facility] long ED with headache features consistent with possible post LP headache (worse with standing, better with laying down).  No further concern for meningitis/encephalitis at this time based on his LP results.  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  Recommendations: -MRI brain with without contrast, this may still show pachymeningeal enhancement consistent with CSF leak secondary to recent LP.  If there are other concerning findings, please reach out to neurology for further recommendations -Post OP headache can be managed conservatively with aggressive hydration and caffeine, but if symptoms are significantly limiting can reach out to anesthesia for blood patch -These are curbside recommendations but neurology is available if a full consultation is needed  Lesleigh Noe MD-PhD [facility] [phone] Available 7 AM to 7 PM, outside these hours please contact Neurologist on call listed on AMION

## 2022-03-13 NOTE — ED Triage Notes (Signed)
Patient was admitted on 10/8 left AMA from ICU bc he was having a baby dx with infection in brain after having seizure.  Patient was treated for chlamydia and finished course. Patient rreturns wanting to check back in bc still having headaches and having pain in vback from LP.

## 2022-03-13 NOTE — ED Notes (Signed)
No answer for vital x 1

## 2022-03-14 ENCOUNTER — Emergency Department (HOSPITAL_COMMUNITY)
Admission: EM | Admit: 2022-03-14 | Discharge: 2022-03-15 | Disposition: A | Discharge status: Home / Self Care | Payer: Medicaid Other | Attending: Emergency Medicine | Admitting: Emergency Medicine

## 2022-03-14 ENCOUNTER — Other Ambulatory Visit: Payer: Self-pay

## 2022-03-14 ENCOUNTER — Encounter (HOSPITAL_COMMUNITY): Payer: Self-pay | Admitting: Emergency Medicine

## 2022-03-14 DIAGNOSIS — R519 Headache, unspecified: Secondary | ICD-10-CM | POA: Insufficient documentation

## 2022-03-14 DIAGNOSIS — M791 Myalgia, unspecified site: Secondary | ICD-10-CM | POA: Diagnosis not present

## 2022-03-14 DIAGNOSIS — R110 Nausea: Secondary | ICD-10-CM | POA: Diagnosis not present

## 2022-03-14 DIAGNOSIS — Z5321 Procedure and treatment not carried out due to patient leaving prior to being seen by health care provider: Secondary | ICD-10-CM | POA: Insufficient documentation

## 2022-03-14 DIAGNOSIS — M545 Low back pain, unspecified: Secondary | ICD-10-CM | POA: Insufficient documentation

## 2022-03-14 DIAGNOSIS — R509 Fever, unspecified: Secondary | ICD-10-CM | POA: Diagnosis not present

## 2022-03-14 DIAGNOSIS — M542 Cervicalgia: Secondary | ICD-10-CM | POA: Diagnosis not present

## 2022-03-14 DIAGNOSIS — Z1152 Encounter for screening for COVID-19: Secondary | ICD-10-CM | POA: Diagnosis not present

## 2022-03-14 DIAGNOSIS — R569 Unspecified convulsions: Secondary | ICD-10-CM | POA: Diagnosis present

## 2022-03-14 LAB — CSF CULTURE W GRAM STAIN: Culture: NO GROWTH

## 2022-03-14 NOTE — ED Triage Notes (Signed)
Patient reports seizure episodes x2 at home this evening with headache and fever .

## 2022-03-14 NOTE — ED Provider Triage Note (Signed)
Emergency Medicine Provider Triage Evaluation Note  Arash Karstens Kentner , a 21 y.o. male  was evaluated in triage.  Pt complains of fever and seizures.  Recently admitted for same 03/10/22, left AMA initially but returned yesterday for remainder of work-up.  He has already had full work-up including labs, LP, MRI w/ and w/out contrast.  States symptoms recurred today and is concerned as he has a newborn at home. Denies new sick contacts.  Review of Systems  Positive: Fever, seizures Negative: vomiting  Physical Exam  BP (!) 144/82   Pulse 93   Temp 98.4 F (36.9 C) (Oral)   Resp 19   SpO2 97%   Gen:   Awake, no distress   Resp:  Normal effort  MSK:   Moves extremities without difficulty  Other:  AAOx3, no focal deficits, no seizure activity observed  Medical Decision Making  Medically screening exam initiated at 11:55 PM.  Appropriate orders placed.  Gavon Majano Fredenburg was informed that the remainder of the evaluation will be completed by another provider, this initial triage assessment does not replace that evaluation, and the importance of remaining in the ED until their evaluation is complete.  Fever, seizures.  Recently admitted for same with full work-up including labs, LP, MRI w/ and w/out contrast.  He is afebrile in triage without focal deficits.  No seizure activity observed.  Work-up reviewed--- no abnormal findings, normal WBC count on labs yesterday, normal MRI.     Larene Pickett, PA-C 03/15/22 0007

## 2022-03-15 ENCOUNTER — Encounter (HOSPITAL_COMMUNITY): Payer: Self-pay | Admitting: Emergency Medicine

## 2022-03-15 ENCOUNTER — Emergency Department (HOSPITAL_COMMUNITY)
Admission: EM | Admit: 2022-03-15 | Discharge: 2022-03-16 | Disposition: A | Discharge status: Home / Self Care | Payer: Medicaid Other | Source: Home / Self Care | Attending: Emergency Medicine | Admitting: Emergency Medicine

## 2022-03-15 DIAGNOSIS — M545 Low back pain, unspecified: Secondary | ICD-10-CM | POA: Insufficient documentation

## 2022-03-15 DIAGNOSIS — R509 Fever, unspecified: Secondary | ICD-10-CM | POA: Insufficient documentation

## 2022-03-15 DIAGNOSIS — M542 Cervicalgia: Secondary | ICD-10-CM | POA: Insufficient documentation

## 2022-03-15 DIAGNOSIS — M791 Myalgia, unspecified site: Secondary | ICD-10-CM | POA: Insufficient documentation

## 2022-03-15 DIAGNOSIS — Z20822 Contact with and (suspected) exposure to covid-19: Secondary | ICD-10-CM | POA: Insufficient documentation

## 2022-03-15 DIAGNOSIS — R110 Nausea: Secondary | ICD-10-CM | POA: Insufficient documentation

## 2022-03-15 DIAGNOSIS — R519 Headache, unspecified: Secondary | ICD-10-CM | POA: Insufficient documentation

## 2022-03-15 LAB — COMPREHENSIVE METABOLIC PANEL
ALT: 69 U/L — ABNORMAL HIGH (ref 0–44)
AST: 42 U/L — ABNORMAL HIGH (ref 15–41)
Albumin: 3.6 g/dL (ref 3.5–5.0)
Alkaline Phosphatase: 76 U/L (ref 38–126)
Anion gap: 6 (ref 5–15)
BUN: 8 mg/dL (ref 6–20)
CO2: 24 mmol/L (ref 22–32)
Calcium: 8.4 mg/dL — ABNORMAL LOW (ref 8.9–10.3)
Chloride: 106 mmol/L (ref 98–111)
Creatinine, Ser: 0.89 mg/dL (ref 0.61–1.24)
GFR, Estimated: >60 mL/min (ref >60)
Glucose, Bld: 134 mg/dL — ABNORMAL HIGH (ref 70–99)
Potassium: 3.8 mmol/L (ref 3.5–5.1)
Sodium: 136 mmol/L (ref 135–145)
Total Bilirubin: 0.4 mg/dL (ref 0.3–1.2)
Total Protein: 7.1 g/dL (ref 6.5–8.1)

## 2022-03-15 LAB — CBC WITH DIFFERENTIAL/PLATELET
Abs Immature Granulocytes: 0.03 10*3/uL (ref 0.00–0.07)
Basophils Absolute: 0 10*3/uL (ref 0.0–0.1)
Basophils Relative: 1 %
Eosinophils Absolute: 0.4 10*3/uL (ref 0.0–0.5)
Eosinophils Relative: 5 %
HCT: 46 % (ref 39.0–52.0)
Hemoglobin: 15.6 g/dL (ref 13.0–17.0)
Immature Granulocytes: 0 %
Lymphocytes Relative: 35 %
Lymphs Abs: 2.8 10*3/uL (ref 0.7–4.0)
MCH: 28.1 pg (ref 26.0–34.0)
MCHC: 33.9 g/dL (ref 30.0–36.0)
MCV: 82.9 fL (ref 80.0–100.0)
Monocytes Absolute: 0.8 10*3/uL (ref 0.1–1.0)
Monocytes Relative: 11 %
Neutro Abs: 3.8 10*3/uL (ref 1.7–7.7)
Neutrophils Relative %: 48 %
Platelets: 209 10*3/uL (ref 150–400)
RBC: 5.55 MIL/uL (ref 4.22–5.81)
RDW: 12.6 % (ref 11.5–15.5)
WBC: 7.9 10*3/uL (ref 4.0–10.5)
nRBC: 0 % (ref 0.0–0.2)

## 2022-03-15 LAB — URINALYSIS, ROUTINE W REFLEX MICROSCOPIC
Bilirubin Urine: NEGATIVE
Glucose, UA: NEGATIVE mg/dL
Hgb urine dipstick: NEGATIVE
Ketones, ur: NEGATIVE mg/dL
Leukocytes,Ua: NEGATIVE
Nitrite: NEGATIVE
Protein, ur: NEGATIVE mg/dL
Specific Gravity, Urine: 1.018 (ref 1.005–1.030)
pH: 6 (ref 5.0–8.0)

## 2022-03-15 LAB — OLIGOCLONAL BANDS, CSF + SERM

## 2022-03-15 LAB — LACTIC ACID, PLASMA: Lactic Acid, Venous: 1.5 mmol/L (ref 0.5–1.9)

## 2022-03-15 NOTE — ED Triage Notes (Addendum)
   Patient comes in with fever and headache that has been going on for about a week.  Patient states he ran a high fever several days ago and had a seizure, was sent to [facility] and had lumbar puncture done that was normal per patient.  Patient states he has not felt well since LP and has tenderness in lower back and 20/10 headache.  Taking tylenol for fever, last dose was 650 mg at 2030.

## 2022-03-15 NOTE — ED Notes (Signed)
X2 vitals recheck with no response 

## 2022-03-16 LAB — ANAEROBIC CULTURE W GRAM STAIN: Gram Stain: NONE SEEN

## 2022-03-16 LAB — CULTURE, BLOOD (ROUTINE X 2)
Culture: NO GROWTH
Culture: NO GROWTH
Special Requests: ADEQUATE
Special Requests: ADEQUATE

## 2022-03-16 LAB — RESP PANEL BY RT-PCR (FLU A&B, COVID) ARPGX2
Influenza A by PCR: NEGATIVE
Influenza B by PCR: NEGATIVE
SARS Coronavirus 2 by RT PCR: NEGATIVE

## 2022-03-16 MED ORDER — SODIUM CHLORIDE 0.9 % IV BOLUS
1000.0000 mL | Freq: Once | INTRAVENOUS | Status: AC
  Administered 2022-03-16: 1000 mL via INTRAVENOUS

## 2022-03-16 MED ORDER — DIPHENHYDRAMINE HCL 50 MG/ML IJ SOLN
25.0000 mg | Freq: Once | INTRAMUSCULAR | Status: AC
  Administered 2022-03-16: 25 mg via INTRAVENOUS
  Filled 2022-03-16: qty 1

## 2022-03-16 MED ORDER — PROCHLORPERAZINE EDISYLATE 10 MG/2ML IJ SOLN
10.0000 mg | Freq: Once | INTRAMUSCULAR | Status: AC
  Administered 2022-03-16: 10 mg via INTRAVENOUS
  Filled 2022-03-16: qty 2

## 2022-03-16 MED ORDER — KETOROLAC TROMETHAMINE 15 MG/ML IJ SOLN
15.0000 mg | Freq: Once | INTRAMUSCULAR | Status: AC
  Administered 2022-03-16: 15 mg via INTRAVENOUS
  Filled 2022-03-16: qty 1

## 2022-03-16 MED ORDER — NAPROXEN 500 MG PO TABS: 500.0000 mg | ORAL_TABLET | Freq: Two times a day (BID) | ORAL | 0 refills | Status: DC | PRN

## 2022-03-16 NOTE — ED Provider Notes (Signed)
[facility]-EMERGENCY DEPT Provider Note   CSN: [id number] Arrival date & time: 03/15/22  2237     History  Chief Complaint  Patient presents with   Fever   Headache    Craig Carlson is a 21 y.o. male with a hx of anxiety, depression, and seizures who returns to the ED with complaints of continued headache.  Patient reports he had a headache since just prior to his initial admission 10/8, states it is generalized, gradual onset, steady progression.  Waxes and wanes in severity.  Seems to be worse with bright lights and when upright.  Taking Tylenol at home without much change.  His initial fevers have resolved per our discussion.  He has not had any additional seizures since he was last at the hospital.  Today he felt a bit nauseous with a headache.  He denies change in vision, numbness, weakness, syncope, vomiting, chest pain, dyspnea, or abdominal pain.   HPI     Home Medications Prior to Admission medications   Medication Sig Start Date End Date Taking? Authorizing Provider  acetaminophen (TYLENOL) 325 MG tablet Take 2 tablets (650 mg total) by mouth every 4 (four) hours as needed for mild pain (temp > 101.5). 03/12/22   Gwenevere Abbot, MD      Allergies    Patient has no known allergies.    Review of Systems   Review of Systems  Constitutional:  Negative for chills and fever (resolved).  Eyes:  Negative for visual disturbance.  Respiratory:  Negative for shortness of breath.   Cardiovascular:  Negative for chest pain.  Gastrointestinal:  Positive for nausea. Negative for abdominal pain and vomiting.  Neurological:  Positive for headaches. Negative for syncope, speech difficulty, weakness and numbness.  All other systems reviewed and are negative.   Physical Exam Updated Vital Signs BP 123/72   Pulse 71   Temp 99 F (37.2 C) (Oral)   Resp 18   Ht 6\' 1"  (1.854 m)   Wt 117.9 kg   SpO2 99%   BMI 34.30 kg/m  Physical Exam Vitals and  nursing note reviewed.  Constitutional:      General: He is not in acute distress.    Appearance: Normal appearance. He is not toxic-appearing.  HENT:     Head: Normocephalic and atraumatic.     Mouth/Throat:     Pharynx: Oropharynx is clear. Uvula midline.  Eyes:     General: Vision grossly intact. Gaze aligned appropriately.     Extraocular Movements: Extraocular movements intact.     Conjunctiva/sclera: Conjunctivae normal.     Pupils: Pupils are equal, round, and reactive to light.     Comments: No proptosis.   Cardiovascular:     Rate and Rhythm: Normal rate and regular rhythm.  Pulmonary:     Effort: Pulmonary effort is normal.     Breath sounds: Normal breath sounds.  Abdominal:     General: There is no distension.     Palpations: Abdomen is soft.     Tenderness: There is no abdominal tenderness. There is no guarding or rebound.  Musculoskeletal:     Cervical back: Normal range of motion and neck supple. No rigidity.  Skin:    General: Skin is warm and dry.  Neurological:     Mental Status: He is alert.     Comments: Alert. Clear speech. No facial droop. CNIII-XII grossly intact. Bilateral upper and lower extremities' sensation grossly intact. 5/5 symmetric strength with grip strength  and with plantar and dorsi flexion bilaterally . Normal finger to nose bilaterally. Negative pronator drift. Gait intact.    Psychiatric:        Mood and Affect: Mood normal.        Behavior: Behavior normal.     ED Results / Procedures / Treatments   Labs (all labs ordered are listed, but only abnormal results are displayed) Labs Reviewed  COMPREHENSIVE METABOLIC PANEL - Abnormal; Notable for the following components:      Result Value   Glucose, Bld 134 (*)    Calcium 8.4 (*)    AST 42 (*)    ALT 69 (*)    All other components within normal limits  RESP PANEL BY RT-PCR (FLU A&B, COVID) ARPGX2  CBC WITH DIFFERENTIAL/PLATELET  URINALYSIS, ROUTINE W REFLEX MICROSCOPIC  LACTIC  ACID, PLASMA    EKG None  Radiology No results found.  Procedures Procedures    Medications Ordered in ED Medications - No data to display  ED Course/ Medical Decision Making/ A&P                           Medical Decision Making Amount and/or Complexity of Data Reviewed Labs: ordered.  Risk Prescription drug management.   Patient presents to the ED with complaints of headache, this involves an extensive number of treatment options, and is a complaint that carries with it a high risk of complications and morbidity. Nontoxic, vitals fairly unremarkable.   Additional history obtained:  Chart/nursing notes reviewed.  External records viewed including:  Recent admission 10/08-10/10 (left AMA) 2023.  Patient presented to the emergency department with complaints of feve, headache, neck/back pain, and body aches.  Found to be febrile and tachycardic.  While awaiting work-up in the ED had a seizure which required Ativan, Keppra, and ultimately intubation for airway protection.  LP was performed and results did not appear consistent with infectious etiology.  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  Patient left AMA despite recommendation to stay for further observation.  Patient return to the emergency department 03/13/2022, with complaints of headache, neurology was consulted, he had unremarkable basic labs at that time as well as an MRI brain with and without contrast that was unremarkable, he ultimately left without being fully evaluated after triage. He again returned to the ED for recurrence of his symptoms,  Lab Tests:  I viewed & interpreted labs including:  CBC, CMP, lactic acid, urinalysis, and COVID/flu testing: Mild transaminitis otherwise unremarkable.  I ordered medications including migraine cocktail (compazine, benadryl, toradol) and fluids.  02:50: RE-EVAL: patient woken from sleep, now pain free, feels  ready to go home.   Patient is afebrile today with no focal neuro deficits, dizziness, change in vision, proptosis, or nuchal rigidity- low suspicion for [facility], ICH, ischemic CVA, dural venous sinus thrombosis, acute glaucoma, giant cell arteritis, mass, or meningitis especially in the setting of recent thorough work up and no significant change in headache quality/severity since. Now pain free.. Overall seems reasonable for discharge. I discussed results, treatment plan, need for PCP follow-up, and return precautions with the patient. Provided opportunity for questions, patient confirmed understanding and is in agreement with plan.    Portions of this note were generated with Scientist, clinical (histocompatibility and immunogenetics). Dictation errors may occur despite best attempts at proofreading.  Final Clinical Impression(s) / ED Diagnoses Final diagnoses:  Nonintractable headache, unspecified chronicity pattern, unspecified headache type  Rx / DC Orders ED Discharge Orders          Ordered    naproxen (NAPROSYN) 500 MG tablet  2 times daily PRN        03/16/22 0320              Calyssa Zobrist, Glynda Jaeger, PA-C 59/93/57 [id number]    Delora Fuel, MD 93/90/30 [phone]

## 2022-03-16 NOTE — Discharge Instructions (Addendum)
You were seen in the emergency department today for headache.  Your blood work was overall reassuring, your liver function test were very mildly elevated, this should be rechecked by your primary care provider in 1 to 2 weeks.  We are sending home with naproxen to take as needed for pain.  Take naproxen with food as it can cause stomach upset and or stomach bleeding.  Do not take other NSAIDs with naproxen such as Advil, Motrin, ibuprofen, Aleve, Goody powder, etc. as these are similar medicines.  We have prescribed you new medication(s) today. Discuss the medications prescribed today with your pharmacist as they can have adverse effects and interactions with your other medicines including over the counter and prescribed medications. Seek medical evaluation if you start to experience new or abnormal symptoms after taking one of these medicines, seek care immediately if you start to experience difficulty breathing, feeling of your throat closing, facial swelling, or rash as these could be indications of a more serious allergic reaction   Please rest and drink plenty of electrolyte containing fluids.  Follow-up with primary care within 1 week.  Return to the emergency department for new or worsening symptoms or any other concerns.`

## 2022-03-22 ENCOUNTER — Telehealth: Payer: Medicaid Other | Admitting: Family Medicine

## 2022-03-22 DIAGNOSIS — R519 Headache, unspecified: Secondary | ICD-10-CM | POA: Diagnosis not present

## 2022-03-22 DIAGNOSIS — R5081 Fever presenting with conditions classified elsewhere: Secondary | ICD-10-CM | POA: Diagnosis not present

## 2022-03-22 DIAGNOSIS — Z9289 Personal history of other medical treatment: Secondary | ICD-10-CM | POA: Diagnosis not present

## 2022-03-22 NOTE — Patient Instructions (Signed)
Fever, Adult     A fever is an increase in the body's temperature. It is usually defined as a temperature of 100.4F (38C) or higher. Brief mild or moderate fevers generally have no long-term effects, and they often do not need treatment. Moderate or high fevers may make you feel uncomfortable and can sometimes be a sign of a serious illness or disease. The sweating that may occur with repeated or prolonged fever may also cause a loss of fluid in the body (dehydration). Fever is confirmed by taking a temperature with a thermometer. A measured temperature can vary with: Age. Time of day. Where in the body you take the temperature. Readings may vary if you place the thermometer: In the mouth (oral). In the rectum (rectal). In the ear (tympanic). Under the arm (axillary). On the forehead (temporal). Follow these instructions at home: Medicines Take over-the counter and prescription medicines only as told by your health care provider. Follow the dosing instructions carefully. If you were prescribed an antibiotic medicine, take it as told by your health care provider. Do not stop taking the antibiotic even if you start to feel better. General instructions Watch your condition for any changes. Let your health care provider know about them. Rest as needed. Drink enough fluid to keep your urine pale yellow. This helps to prevent dehydration. Sponge yourself or bathe with room-temperature water to help reduce your body temperature as needed. Do not use ice water. Do not use too many blankets or wear clothes that are too heavy. If your fever may be caused by an infection that spreads from person to person (is contagious), such as a cold or the flu, you should stay home from work and public gatherings for at least 24 hours after your fever is gone. Your fever should be gone without the need to use medicines. Contact a health care provider if: You vomit. You cannot eat or drink without vomiting. You  have diarrhea. You have pain when you urinate. Your symptoms do not improve with treatment. You develop new symptoms. You develop excessive weakness. Get help right away if: You have shortness of breath or have trouble breathing. You are dizzy or you faint. You are disoriented or confused. You develop signs of dehydration, such as: Dark urine, very little urine, or no urine. Cracked lips. Dry mouth. Sunken eyes. Sleepiness. Weakness. You develop severe pain in your abdomen. You have persistent vomiting or diarrhea. You develop a skin rash. Your symptoms suddenly get worse. Summary A fever is an increase in the body's temperature. It is usually defined as a temperature of 100.4F (38C) or higher. Moderate or high fevers can sometimes be a sign of a serious illness or disease. The sweating that may occur with repeated or prolonged fever may also cause dehydration. Pay attention to any changes in your symptoms and contact your health care provider if your symptoms do not improve with treatment. Take over-the counter and prescription medicines only as told by your health care provider. Follow the dosing instructions carefully. If your fever is from an infection that may be contagious, such as cold or flu, you should stay home from work and public gatherings for at least 24 hours after your fever is gone. Your fever should be gone without the need to use medicines. Get help right away if you develop signs of dehydration, such as dark urine, cracked lips, dry mouth, sunken eyes, sleepiness, or weakness. This information is not intended to replace advice given to you by   your health care provider. Make sure you discuss any questions you have with your health care provider. Document Revised: 09/17/2021 Document Reviewed: 10/10/2020 Elsevier Patient Education  2023 Elsevier Inc.  

## 2022-03-22 NOTE — Progress Notes (Signed)
Virtual Visit Consent   Amiel Mccaffrey Mounts, you are scheduled for a virtual visit with a [facility] provider today. Just as with appointments in the office, your consent must be obtained to participate. Your consent will be active for this visit and any virtual visit you may have with one of our providers in the next 365 days. If you have a MyChart account, a copy of this consent can be sent to you electronically.  As this is a virtual visit, video technology does not allow for your provider to perform a traditional examination. This may limit your provider's ability to fully assess your condition. If your provider identifies any concerns that need to be evaluated in person or the need to arrange testing (such as labs, EKG, etc.), we will make arrangements to do so. Although advances in technology are sophisticated, we cannot ensure that it will always work on either your end or our end. If the connection with a video visit is poor, the visit may have to be switched to a telephone visit. With either a video or telephone visit, we are not always able to ensure that we have a secure connection.  By engaging in this virtual visit, you consent to the provision of healthcare and authorize for your insurance to be billed (if applicable) for the services provided during this visit. Depending on your insurance coverage, you may receive a charge related to this service.  I need to obtain your verbal consent now. Are you willing to proceed with your visit today? Yuto Cajuste Bacus has provided verbal consent on 03/22/2022 for a virtual visit (video or telephone). Georgana Curio, FNP  Date: 03/22/2022 7:27 PM  Virtual Visit via Video Note   I, Georgana Curio, connected with  Cali Cuartas Gaubert  ([id number], 2001/01/07) on 03/22/22 at  7:15 PM EDT by a video-enabled telemedicine application and verified that I am speaking with the correct person using two identifiers.  Location: Patient: Virtual Visit  Location Patient: Home Provider: Virtual Visit Location Provider: Home Office   I discussed the limitations of evaluation and management by telemedicine and the availability of in person appointments. The patient expressed understanding and agreed to proceed.    History of Present Illness: Renald Haithcock Homann is a 21 y.o. who identifies as a male who was assigned male at birth, and is being seen today for fever and headache. He was hospitalized for a week and got out about 10 days ago. He had a spinal tap. He says they found no reason for the fever, treated with IV antibiotics and he was released. He now has pressure in the back of his head and a temp of 103.   HPI: HPI  Problems:  Patient Active Problem List   Diagnosis Date Noted   Status epilepticus (HCC) 03/10/2022   Seizure (HCC) 03/10/2022   Acute respiratory failure (HCC)    Acute encephalopathy    Encounter for screening examination for sexually transmitted disease 01/25/2021   Annual physical exam 01/25/2021   Concussion with no loss of consciousness 01/08/2021   Seizure disorder (HCC) 01/08/2021   Hx of multiple concussions 01/05/2021   Acute pain of left knee 12/20/2020   Chronic low back pain 12/20/2020    Allergies: No Known Allergies Medications:  Current Outpatient Medications:    acetaminophen (TYLENOL) 325 MG tablet, Take 2 tablets (650 mg total) by mouth every 4 (four) hours as needed for mild pain (temp > 101.5)., Disp: , Rfl:    naproxen (NAPROSYN)  500 MG tablet, Take 1 tablet (500 mg total) by mouth 2 (two) times daily as needed., Disp: 15 tablet, Rfl: 0  Observations/Objective: Patient is well-developed, well-nourished in no acute distress.  Resting comfortably  at home.  Head is normocephalic, atraumatic.  No labored breathing.  Speech is clear and coherent with logical content.  Patient is alert and oriented at baseline.    Assessment and Plan: 1. Fever in other diseases  2. History of recent  hospitalization  Instructed to go to the ED for further eval.   Follow Up Instructions: I discussed the assessment and treatment plan with the patient. The patient was provided an opportunity to ask questions and all were answered. The patient agreed with the plan and demonstrated an understanding of the instructions.  A copy of instructions were sent to the patient via MyChart unless otherwise noted below.     The patient was advised to call back or seek an in-person evaluation if the symptoms worsen or if the condition fails to improve as anticipated.  Time:  I spent 10 minutes with the patient via telehealth technology discussing the above problems/concerns.    Dellia Nims, FNP

## 2022-03-28 ENCOUNTER — Ambulatory Visit: Payer: Medicaid Other | Admitting: Family Medicine

## 2022-03-28 ENCOUNTER — Emergency Department: Payer: Medicaid Other

## 2022-03-28 ENCOUNTER — Emergency Department
Admission: EM | Admit: 2022-03-28 | Discharge: 2022-03-28 | Disposition: A | Discharge status: Home / Self Care | Payer: Medicaid Other | Attending: Emergency Medicine | Admitting: Emergency Medicine

## 2022-03-28 ENCOUNTER — Other Ambulatory Visit: Payer: Self-pay

## 2022-03-28 ENCOUNTER — Encounter: Payer: Self-pay | Admitting: Emergency Medicine

## 2022-03-28 DIAGNOSIS — R7401 Elevation of levels of liver transaminase levels: Secondary | ICD-10-CM | POA: Diagnosis not present

## 2022-03-28 DIAGNOSIS — R112 Nausea with vomiting, unspecified: Secondary | ICD-10-CM | POA: Diagnosis not present

## 2022-03-28 DIAGNOSIS — R109 Unspecified abdominal pain: Secondary | ICD-10-CM | POA: Diagnosis not present

## 2022-03-28 DIAGNOSIS — R7989 Other specified abnormal findings of blood chemistry: Secondary | ICD-10-CM

## 2022-03-28 LAB — COMPREHENSIVE METABOLIC PANEL
ALT: 183 U/L — ABNORMAL HIGH (ref 0–44)
AST: 123 U/L — ABNORMAL HIGH (ref 15–41)
Albumin: 3 g/dL — ABNORMAL LOW (ref 3.5–5.0)
Alkaline Phosphatase: 72 U/L (ref 38–126)
Anion gap: 7 (ref 5–15)
BUN: 9 mg/dL (ref 6–20)
CO2: 22 mmol/L (ref 22–32)
Calcium: 7.5 mg/dL — ABNORMAL LOW (ref 8.9–10.3)
Chloride: 107 mmol/L (ref 98–111)
Creatinine, Ser: 1.04 mg/dL (ref 0.61–1.24)
GFR, Estimated: >60 mL/min (ref >60)
Glucose, Bld: 101 mg/dL — ABNORMAL HIGH (ref 70–99)
Potassium: 3.8 mmol/L (ref 3.5–5.1)
Sodium: 136 mmol/L (ref 135–145)
Total Bilirubin: 0.9 mg/dL (ref 0.3–1.2)
Total Protein: 6.5 g/dL (ref 6.5–8.1)

## 2022-03-28 LAB — CBC WITH DIFFERENTIAL/PLATELET
Abs Immature Granulocytes: 0.02 10*3/uL (ref 0.00–0.07)
Basophils Absolute: 0.2 10*3/uL — ABNORMAL HIGH (ref 0.0–0.1)
Basophils Relative: 1 %
Eosinophils Absolute: 0.2 10*3/uL (ref 0.0–0.5)
Eosinophils Relative: 1 %
HCT: 41.9 % (ref 39.0–52.0)
Hemoglobin: 13.5 g/dL (ref 13.0–17.0)
Immature Granulocytes: 0 %
Lymphocytes Relative: 83 %
Lymphs Abs: 14.5 10*3/uL — ABNORMAL HIGH (ref 0.7–4.0)
MCH: 26.9 pg (ref 26.0–34.0)
MCHC: 32.2 g/dL (ref 30.0–36.0)
MCV: 83.6 fL (ref 80.0–100.0)
Monocytes Absolute: 1.1 10*3/uL — ABNORMAL HIGH (ref 0.1–1.0)
Monocytes Relative: 6 %
Neutro Abs: 1.5 10*3/uL — ABNORMAL LOW (ref 1.7–7.7)
Neutrophils Relative %: 9 %
Platelets: 172 10*3/uL (ref 150–400)
RBC: 5.01 MIL/uL (ref 4.22–5.81)
RDW: 13.8 % (ref 11.5–15.5)
Smear Review: NORMAL
WBC: 17.6 10*3/uL — ABNORMAL HIGH (ref 4.0–10.5)
nRBC: 0 % (ref 0.0–0.2)

## 2022-03-28 LAB — PATHOLOGIST SMEAR REVIEW

## 2022-03-28 LAB — LIPASE, BLOOD: Lipase: 55 U/L — ABNORMAL HIGH (ref 11–51)

## 2022-03-28 MED ORDER — IOHEXOL 350 MG/ML SOLN
100.0000 mL | Freq: Once | INTRAVENOUS | Status: AC | PRN
  Administered 2022-03-28: 100 mL via INTRAVENOUS

## 2022-03-28 MED ORDER — ONDANSETRON HCL 4 MG/2ML IJ SOLN
4.0000 mg | Freq: Once | INTRAMUSCULAR | Status: AC
  Administered 2022-03-28: 4 mg via INTRAVENOUS
  Filled 2022-03-28: qty 2

## 2022-03-28 MED ORDER — ONDANSETRON HCL 4 MG PO TABS: 4.0000 mg | ORAL_TABLET | Freq: Every day | ORAL | 1 refills | Status: DC | PRN

## 2022-03-28 MED ORDER — SODIUM CHLORIDE 0.9 % IV BOLUS
1000.0000 mL | Freq: Once | INTRAVENOUS | Status: AC
  Administered 2022-03-28: 1000 mL via INTRAVENOUS

## 2022-03-28 NOTE — ED Provider Notes (Signed)
[facility] Provider Note    Event Date/Time   First MD Initiated Contact with Patient 03/28/22 0011     (approximate)   History   Abdominal Pain   HPI  Craig Carlson is a 21 y.o. male   Past medical history of anxiety, depression, seizures who presents to the emergency department with 2 weeks of nausea and vomiting with intermittent abdominal pain.  He denies fever or chills.  No dysuria or testicular pain.  No diarrhea.  No blood in the stools.  No blood in the emesis.  He occasionally smokes marijuana.    History was obtained via the patient and a friend who is at bedside.      Physical Exam   Triage Vital Signs: ED Triage Vitals  Enc Vitals Group     BP 03/28/22 0011 104/65     Pulse Rate 03/28/22 0011 97     Resp 03/28/22 0011 16     Temp 03/28/22 0011 (!) 100.4 F (38 C)     Temp Source 03/28/22 0011 Oral     SpO2 03/28/22 0011 99 %     Weight 03/28/22 0009 250 lb (113.4 kg)     Height 03/28/22 0009 6\' 3"  (1.905 m)     Head Circumference --      Peak Flow --      Pain Score 03/28/22 0008 5     Pain Loc --      Pain Edu? --      Excl. in [place]? --     Most recent vital signs: Vitals:   03/28/22 0011  BP: 104/65  Pulse: 97  Resp: 16  Temp: (!) 100.4 F (38 C)  SpO2: 99%    General: Awake, no distress.  CV:  Good peripheral perfusion.  Resp:  Normal effort.  Abd:  No distention.  Soft, no rigidity. Other:  Febrile 100.4.   ED Results / Procedures / Treatments   Labs (all labs ordered are listed, but only abnormal results are displayed) Labs Reviewed  CBC WITH DIFFERENTIAL/PLATELET - Abnormal; Notable for the following components:      Result Value   WBC 17.6 (*)    Neutro Abs 1.5 (*)    Lymphs Abs 14.5 (*)    Monocytes Absolute 1.1 (*)    Basophils Absolute 0.2 (*)    All other components within normal limits  COMPREHENSIVE METABOLIC PANEL - Abnormal; Notable for the following components:   Glucose, Bld 101  (*)    Calcium 7.5 (*)    Albumin 3.0 (*)    AST 123 (*)    ALT 183 (*)    All other components within normal limits  LIPASE, BLOOD - Abnormal; Notable for the following components:   Lipase 55 (*)    All other components within normal limits     I reviewed labs and they are notable for leukocytosis WBC 17 and elevated LFTs in the 100s and mildly elevated lipase 55   RADIOLOGY I independently reviewed and interpreted CT scan of the abdomen pelvis with IV contrast and see no infectious or obstructive patterns.   PROCEDURES:  Critical Care performed: No  Procedures   MEDICATIONS ORDERED IN ED: Medications  ondansetron (ZOFRAN) injection 4 mg (4 mg Intravenous Given 03/28/22 0105)  sodium chloride 0.9 % bolus 1,000 mL (0 mLs Intravenous Stopped 03/28/22 0231)  iohexol (OMNIPAQUE) 350 MG/ML injection 100 mL (100 mLs Intravenous Contrast Given 03/28/22 0157)     IMPRESSION /  MDM / ASSESSMENT AND PLAN / ED COURSE  I reviewed the triage vital signs and the nursing notes.                              Differential diagnosis includes, but is not limited to, appendicitis, gastroenteritis, cannabinoid hyperemesis syndrome, cholecystitis, considered other causes of his low-grade fever but patient denies respiratory infectious symptoms, doubt pneumonia or viral upper respiratory infection.  Doubt urinary infection given no urinary symptoms.   MDM: This patient with nausea and vomiting for 2 weeks and a low-grade fever concern for appendicitis, will rule out that along with other intra-abdominal surgical pathology with a CT scan of the abdomen pelvis with IV contrast.  Given his nausea and vomiting, I will administer IV fluids for likely some element of dehydration along with IV Zofran for antiemetic.   The CT scan showed no gallstones, or signs of cholecystitis and a normal-appearing appendix.  No surgical abdominal pathology recognized on CT scan.  Some evidence of sequela of mild  enteritis.  Patient is stable in the emergency department with some improvement of symptoms after IV fluids and IV antiemetic.  Dispo: After careful consideration of this patient's presentation, medical and social risk factors, and evaluation in the emergency department I engaged in shared decision making with the patient and/or their representative to consider admission or observation and this patient was ultimately discharged because CT scan shows no surgical pathology and the patient is stable, with plans for antiemetics prescription and follow-up with PMD..   Patient's presentation is most consistent with acute presentation with potential threat to life or bodily function.       FINAL CLINICAL IMPRESSION(S) / ED DIAGNOSES   Final diagnoses:  Nausea and vomiting, unspecified vomiting type  Elevated LFTs     Rx / DC Orders   ED Discharge Orders     None        Note:  This document was prepared using Dragon voice recognition software and may include unintentional dictation errors.    Pilar Jarvis, MD 03/28/22 [phone]

## 2022-03-28 NOTE — ED Triage Notes (Signed)
Patient ambulatory to triage with steady gait, without difficulty or distress noted; pt reports N/V and abd pain x 1-2wks

## 2022-03-28 NOTE — ED Notes (Signed)
EDP, Jacelyn Grip at bedside.

## 2022-03-28 NOTE — Discharge Instructions (Addendum)
Take Zofran for nausea as needed. Stop using marijuana and marijuana related products as these may be related to your nausea and vomiting.  Your testing today revealed no signs of appendicitis, cholecystitis, or other problems inside of your abdomen that require surgery or antibiotics.  Your liver tests were slightly elevated, please check with your doctor to have these levels rechecked in the next couple of weeks.  Thank you for choosing [place] for your health care today!  Please see your primary doctor this week for a follow up appointment.   If you do not have a primary doctor call the following clinics to establish care:  If you have insurance:  [facility] [phone] [place] [address]   [facility]  [phone] [place] [place] [address]   If you do not have insurance:  [facility]  [phone] [address] [place] [address]  Sometimes, in the early stages of certain disease courses it is difficult to detect in the emergency department evaluation -- so, it is important that you continue to monitor your symptoms and call your doctor right away or return to the emergency department if you develop any new or worsening symptoms.  It was my pleasure to care for you today.   Hoover Brunette Jacelyn Grip, MD

## 2022-04-02 ENCOUNTER — Ambulatory Visit: Payer: Medicaid Other | Admitting: Family Medicine

## 2022-04-08 LAB — FUNGUS CULTURE WITH STAIN

## 2022-04-08 LAB — FUNGUS CULTURE RESULT

## 2022-04-08 LAB — FUNGAL ORGANISM REFLEX

## 2022-04-16 ENCOUNTER — Ambulatory Visit (INDEPENDENT_AMBULATORY_CARE_PROVIDER_SITE_OTHER): Payer: Medicaid Other | Admitting: Gastroenterology

## 2022-04-16 ENCOUNTER — Encounter: Payer: Self-pay | Admitting: Gastroenterology

## 2022-04-16 VITALS — BP 126/78 | HR 94 | Temp 98.7°F | Ht 73.0 in | Wt 239.0 lb

## 2022-04-16 DIAGNOSIS — R748 Abnormal levels of other serum enzymes: Secondary | ICD-10-CM

## 2022-04-16 DIAGNOSIS — R634 Abnormal weight loss: Secondary | ICD-10-CM | POA: Diagnosis not present

## 2022-04-16 DIAGNOSIS — R112 Nausea with vomiting, unspecified: Secondary | ICD-10-CM | POA: Diagnosis not present

## 2022-04-16 NOTE — Addendum Note (Signed)
Addended by: Roena Malady on: 04/16/2022 03:04 PM   Modules accepted: Orders

## 2022-04-16 NOTE — Progress Notes (Signed)
Gastroenterology Consultation  Referring Provider:     ED Primary Care Physician:  Pcp, No Primary Gastroenterologist:  Dr. Allen Norris     Reason for Consultation:     Weight loss and abnormal liver enzymes        HPI:   Craig Carlson is a 21 y.o. y/o male referred for consultation & management of weight loss and abnormal liver enzymes by Dr. Merryl Hacker, No.  This patient comes in today with a history of vomiting.  The patient states he vomits after he smells food and after he eats.  The patient states that he has lost 30 pounds in the last month or so.  The patient was in the emergency department and states that he was in the ED for 10 days while they worked him up for an infection.  He states that he was told that he had a infection presenting as elevated white cell count with fevers.  The patient has CT scan of the abdomen that showed:   IMPRESSION: 1. Findings may represent sequelae associated with mild enteritis within the distal ileum. 2. Small hiatal hernia. 3. Small amount of posterior pelvic free fluid.  The patient reports that he does feel better when he smokes marijuana and is able to keep the food down but at other times vomits right after he eats.  He reports that he also had a spinal tap at his first hospital visit to rule out meningitis.  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  Past Medical History:  Diagnosis Date   Anxiety    Depression    Seizures ([place])     No past surgical history on file.  Prior to Admission medications   Medication Sig Start Date End Date Taking? Authorizing Provider  acetaminophen (TYLENOL) 325 MG tablet Take 2 tablets (650 mg total) by mouth every 4 (four) hours as needed for mild pain (temp > 101.5). 03/12/22  Yes Idamae Schuller, MD  naproxen (NAPROSYN) 500 MG tablet Take 1 tablet (500 mg total) by mouth 2 (two) times daily as needed. 03/16/22  Yes Petrucelli, Samantha R,  PA-C  ondansetron (ZOFRAN) 4 MG tablet Take 1 tablet (4 mg total) by mouth daily as needed for nausea or vomiting. 03/28/22 03/28/23 Yes Lucillie Garfinkel, MD    Family History  Problem Relation Age of Onset   Diabetes Mother    Diabetes Father    Cancer Maternal Aunt    Diabetes Maternal Grandmother    Heart attack Maternal Grandmother    Diabetes Paternal Grandmother      Social History   Tobacco Use   Smoking status: Never   Smokeless tobacco: Never  Vaping Use   Vaping Use: Never used  Substance Use Topics   Alcohol use: Not Currently    Alcohol/week: 1.0 - 2.0 standard drink of alcohol    Types: 1 - 2 Standard drinks or equivalent per week   Drug use: Not Currently    Types: Marijuana    Allergies as of 04/16/2022   (No Known Allergies)    Review of Systems:    All systems reviewed and negative except where noted in HPI.   Physical Exam:  There were no vitals taken for this visit. No LMP for male patient. General:   Alert,  Well-developed, well-nourished, pleasant and cooperative in NAD Head:  Normocephalic and atraumatic. Eyes:  Sclera clear, no icterus.   Conjunctiva pink. Ears:  Normal auditory acuity. Neck:  Supple; no masses or thyromegaly. Lungs:  Respirations even and unlabored.  Clear throughout to auscultation.   No wheezes, crackles, or rhonchi. No acute distress. Heart:  Regular rate and rhythm; no murmurs, clicks, rubs, or gallops. Abdomen:  Normal bowel sounds.  No bruits.  Soft, non-tender and non-distended without masses, hepatosplenomegaly or hernias noted.  No guarding or rebound tenderness.  Negative Carnett sign.   Rectal:  Deferred.  Pulses:  Normal pulses noted. Extremities:  No clubbing or edema.  No cyanosis. Neurologic:  Alert and oriented x3;  grossly normal neurologically. Skin:  Intact without significant lesions or rashes.  No jaundice. Lymph Nodes:  No significant cervical adenopathy. Psych:  Alert and cooperative. Normal mood and  affect.  Imaging Studies: CT Abdomen Pelvis W Contrast  Result Date: 03/28/2022 CLINICAL DATA:  Abdominal pain with nausea and vomiting times 1-2 weeks. EXAM: CT ABDOMEN AND PELVIS WITH CONTRAST TECHNIQUE: Multidetector CT imaging of the abdomen and pelvis was performed using the standard protocol following bolus administration of intravenous contrast. RADIATION DOSE REDUCTION: This exam was performed according to the departmental dose-optimization program which includes automated exposure control, adjustment of the mA and/or kV according to patient size and/or use of iterative reconstruction technique. CONTRAST:  OMNIPAQUE IOHEXOL 350 MG/ML SOLN COMPARISON:  None Available. FINDINGS: Lower chest: A small, stable fat containing hernia is seen along the posterior aspect of the right costophrenic angle. Hepatobiliary: No focal liver abnormality is seen. No gallstones, gallbladder wall thickening, or biliary dilatation. Pancreas: Unremarkable. No pancreatic ductal dilatation or surrounding inflammatory changes. Spleen: Normal in size without focal abnormality. Adrenals/Urinary Tract: Adrenal glands are unremarkable. Kidneys are normal, without renal calculi, focal lesion, or hydronephrosis. Bladder is unremarkable. Stomach/Bowel: There is a small hiatal hernia. There is poor gastric distension. Appendix appears normal. No evidence of bowel dilatation. The proximal duodenum is poorly distended. Mildly thickened, poorly distended loops of distal ileum are also seen within the pelvis on the right. Vascular/Lymphatic: No significant vascular findings are present. Numerous subcentimeter mesenteric lymph nodes are seen scattered throughout the abdomen. A 2 mm left inguinal lymph node is noted. Multiple additional subcentimeter bilateral inguinal lymph nodes are seen. Reproductive: Prostate is unremarkable. Other: No abdominal wall hernia or abnormality. A small amount of posterior pelvic free fluid is noted.  Musculoskeletal: No acute or significant osseous findings. IMPRESSION: 1. Findings may represent sequelae associated with mild enteritis within the distal ileum. 2. Small hiatal hernia. 3. Small amount of posterior pelvic free fluid. Electronically Signed   By: Aram Candela M.D.   On: 03/28/2022 02:27    Assessment and Plan:   Craig Carlson is a 21 y.o. y/o male who comes in today with abnormal liver enzymes with elevated white cell count and an abnormal CT scan showing mild enteritis at his recent ED visit.  The patient also had abnormal liver enzymes.  The patient will have his liver enzymes and a CBC rechecked today.  The patient will also be set up for an upper endoscopy due to his recurrent nausea and vomiting and weight loss.  The patient denies any family history of colon cancer or GI issues.  The patient has been told that since his symptoms do improve with smoking marijuana it may be more of a central issue than it is a anatomical issue.  The patient will also have some basic labs to rule out any common reasons for his abnormal liver enzymes including checking his hepatitis panel.  The patient has been explained the plan agrees with it.    Lucilla Lame, MD. Marval Regal    Note: This dictation was prepared with Dragon dictation along with smaller phrase technology. Any transcriptional errors that result from this process are unintentional.

## 2022-04-16 NOTE — Addendum Note (Signed)
Addended by: Roena Malady on: 04/16/2022 04:22 PM   Modules accepted: Orders

## 2022-04-17 LAB — COMPREHENSIVE METABOLIC PANEL
ALT: 40 IU/L (ref 0–44)
AST: 28 IU/L (ref 0–40)
Albumin/Globulin Ratio: 1.3 (ref 1.2–2.2)
Albumin: 4.2 g/dL — ABNORMAL LOW (ref 4.3–5.2)
Alkaline Phosphatase: 86 IU/L (ref 51–125)
BUN/Creatinine Ratio: 10 (ref 9–20)
BUN: 9 mg/dL (ref 6–20)
Bilirubin Total: 0.5 mg/dL (ref 0.0–1.2)
CO2: 22 mmol/L (ref 20–29)
Calcium: 9.6 mg/dL (ref 8.7–10.2)
Chloride: 107 mmol/L — ABNORMAL HIGH (ref 96–106)
Creatinine, Ser: 0.9 mg/dL (ref 0.76–1.27)
Globulin, Total: 3.3 g/dL (ref 1.5–4.5)
Glucose: 111 mg/dL — ABNORMAL HIGH (ref 70–99)
Potassium: 4.1 mmol/L (ref 3.5–5.2)
Sodium: 144 mmol/L (ref 134–144)
Total Protein: 7.5 g/dL (ref 6.0–8.5)
eGFR: 125 mL/min/{1.73_m2} (ref >59)

## 2022-04-17 LAB — HEPATITIS C ANTIBODY: Hep C Virus Ab: NONREACTIVE

## 2022-04-17 LAB — CBC
Hematocrit: 43.7 % (ref 37.5–51.0)
Hemoglobin: 14.5 g/dL (ref 13.0–17.7)
MCH: 27.9 pg (ref 26.6–33.0)
MCHC: 33.2 g/dL (ref 31.5–35.7)
MCV: 84 fL (ref 79–97)
Platelets: 279 10*3/uL (ref 150–450)
RBC: 5.2 x10E6/uL (ref 4.14–5.80)
RDW: 13.6 % (ref 11.6–15.4)
WBC: 6.2 10*3/uL (ref 3.4–10.8)

## 2022-04-17 LAB — HEPATITIS B SURFACE ANTIGEN: Hepatitis B Surface Ag: NEGATIVE

## 2022-04-17 LAB — HEPATITIS B SURFACE ANTIBODY,QUALITATIVE: Hep B Surface Ab, Qual: REACTIVE

## 2022-04-17 LAB — HEPATITIS A ANTIBODY, TOTAL: hep A Total Ab: POSITIVE — AB

## 2022-04-29 ENCOUNTER — Encounter: Admission: RE | Payer: Self-pay | Source: Home / Self Care

## 2022-04-29 ENCOUNTER — Ambulatory Visit: Admission: RE | Admit: 2022-04-29 | Payer: Medicaid Other | Source: Home / Self Care | Admitting: Gastroenterology

## 2022-04-29 SURGERY — ESOPHAGOGASTRODUODENOSCOPY (EGD) WITH PROPOFOL
Anesthesia: Choice

## 2022-05-25 ENCOUNTER — Encounter (HOSPITAL_BASED_OUTPATIENT_CLINIC_OR_DEPARTMENT_OTHER): Payer: Self-pay | Admitting: Emergency Medicine

## 2022-05-25 ENCOUNTER — Emergency Department (HOSPITAL_BASED_OUTPATIENT_CLINIC_OR_DEPARTMENT_OTHER)
Admission: EM | Admit: 2022-05-25 | Discharge: 2022-05-25 | Discharge status: Against Medical Advice | Payer: Medicaid Other | Attending: Emergency Medicine | Admitting: Emergency Medicine

## 2022-05-25 DIAGNOSIS — Z5321 Procedure and treatment not carried out due to patient leaving prior to being seen by health care provider: Secondary | ICD-10-CM | POA: Diagnosis present

## 2022-05-25 NOTE — ED Triage Notes (Signed)
Patient states he had testing done back in November and was told he was positive for Hep A. Patient here to be "treated" for it. After reviewing chart it appears his positive test was for Hep A antibody.... Patient states he can't wait long and needs to get back to work. Informed patient I could not give him an exact wait time. Patient left ED

## 2022-05-26 ENCOUNTER — Other Ambulatory Visit: Payer: Self-pay

## 2022-05-26 DIAGNOSIS — Z5321 Procedure and treatment not carried out due to patient leaving prior to being seen by health care provider: Secondary | ICD-10-CM | POA: Diagnosis not present

## 2022-05-26 DIAGNOSIS — B159 Hepatitis A without hepatic coma: Secondary | ICD-10-CM | POA: Insufficient documentation

## 2022-05-26 NOTE — ED Triage Notes (Signed)
Pt states he was dx w hepatits A (antibody) in November, wants to be "treated" for it.

## 2022-05-27 ENCOUNTER — Encounter (HOSPITAL_BASED_OUTPATIENT_CLINIC_OR_DEPARTMENT_OTHER): Payer: Self-pay | Admitting: Emergency Medicine

## 2022-05-27 ENCOUNTER — Emergency Department (HOSPITAL_BASED_OUTPATIENT_CLINIC_OR_DEPARTMENT_OTHER)
Admission: EM | Admit: 2022-05-27 | Discharge: 2022-05-27 | Disposition: A | Discharge status: Home / Self Care | Payer: Medicaid Other | Source: Home / Self Care | Attending: Emergency Medicine | Admitting: Emergency Medicine

## 2022-05-27 ENCOUNTER — Emergency Department (HOSPITAL_BASED_OUTPATIENT_CLINIC_OR_DEPARTMENT_OTHER)
Admission: EM | Admit: 2022-05-27 | Discharge: 2022-05-27 | Discharge status: Against Medical Advice | Payer: Medicaid Other | Attending: Emergency Medicine | Admitting: Emergency Medicine

## 2022-05-27 DIAGNOSIS — B159 Hepatitis A without hepatic coma: Secondary | ICD-10-CM | POA: Insufficient documentation

## 2022-05-27 DIAGNOSIS — Z202 Contact with and (suspected) exposure to infections with a predominantly sexual mode of transmission: Secondary | ICD-10-CM

## 2022-05-27 DIAGNOSIS — R768 Other specified abnormal immunological findings in serum: Secondary | ICD-10-CM

## 2022-05-27 LAB — URINALYSIS, ROUTINE W REFLEX MICROSCOPIC
Bilirubin Urine: NEGATIVE
Glucose, UA: NEGATIVE mg/dL
Hgb urine dipstick: NEGATIVE
Ketones, ur: NEGATIVE mg/dL
Leukocytes,Ua: NEGATIVE
Nitrite: NEGATIVE
Specific Gravity, Urine: 1.035 — ABNORMAL HIGH (ref 1.005–1.030)
pH: 6 (ref 5.0–8.0)

## 2022-05-27 NOTE — ED Triage Notes (Signed)
Patient here to be treated for "Hep A." After reviewing chart it appears his positive test was for Hep A antibody.

## 2022-05-27 NOTE — ED Provider Notes (Signed)
   [facility] EMERGENCY DEPT  Provider Note  CSN: [id number] Arrival date & time: 05/27/22 8295  History Chief Complaint  Patient presents with   Follow-up    Craig Carlson is a 21 y.o. male here because of a positive Hepatitis A antibody test. In October, he had a GI illness with elevated LFTs. Had hepatitis panel done during that time frame that showed a positive Hep A Ab. He was never informed of this result and only found out by reviewing his lab results in MyChart and became concerned he needed to be treated for this result.   Patient also reports his girlfriend recently tested positive for chlamydia and trich and he would like to be tested. He is not having any symptoms.    Home Medications Prior to Admission medications   Not on File     Allergies    Patient has no known allergies.   Review of Systems   Review of Systems Please see HPI for pertinent positives and negatives  Physical Exam BP 134/75 (BP Location: Right Arm)   Pulse 81   Temp 97.8 F (36.6 C) (Oral)   Resp 20   Ht 6\' 1"  (1.854 m)   Wt 108.9 kg   SpO2 98%   BMI 31.66 kg/m   Physical Exam Vitals and nursing note reviewed.  HENT:     Head: Normocephalic.     Nose: Nose normal.  Eyes:     Extraocular Movements: Extraocular movements intact.  Pulmonary:     Effort: Pulmonary effort is normal.  Musculoskeletal:        General: Normal range of motion.     Cervical back: Neck supple.  Skin:    Findings: No rash (on exposed skin).  Neurological:     Mental Status: He is alert and oriented to person, place, and time.  Psychiatric:        Mood and Affect: Mood normal.     ED Results / Procedures / Treatments   EKG None  Procedures Procedures  Medications Ordered in the ED Medications - No data to display  Initial Impression and Plan  Patient reassured no treatment is necessary for a positive HepA Ab and in fact indicates he has been exposed in the past and has  some ongoing immunity. Will check UA and GC/CT. Will hold off on treatment for GC/CT given he is asymptomatic.   ED Course   Clinical Course as of 05/27/22 0413  Mon May 27, 2022  0412 UA is clear.  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 Labs: ordered. Decision-making details documented in ED Course.    Final Clinical Impression(s) / ED Diagnoses Final diagnoses:  Hepatitis A antibody positive  Possible exposure to STD    Rx / DC Orders ED Discharge Orders     None        Pollyann Savoy, MD 05/27/22 [phone]

## 2022-05-27 NOTE — ED Notes (Signed)
Patient disclosed to provider that he has had possible exposure to STD as well. Urine sample collected and sent

## 2022-05-28 LAB — GC/CHLAMYDIA PROBE AMP (~~LOC~~) NOT AT ARMC
Chlamydia: POSITIVE — AB
Comment: NEGATIVE
Comment: NORMAL
Neisseria Gonorrhea: NEGATIVE

## 2022-06-01 ENCOUNTER — Other Ambulatory Visit (HOSPITAL_BASED_OUTPATIENT_CLINIC_OR_DEPARTMENT_OTHER): Payer: Self-pay | Admitting: Obstetrics & Gynecology

## 2022-06-01 DIAGNOSIS — Z202 Contact with and (suspected) exposure to infections with a predominantly sexual mode of transmission: Secondary | ICD-10-CM

## 2022-06-01 MED ORDER — METRONIDAZOLE 500 MG PO TABS: 500.0000 mg | ORAL_TABLET | Freq: Two times a day (BID) | ORAL | 0 refills | Status: AC

## 2022-06-01 MED ORDER — DOXYCYCLINE HYCLATE 100 MG PO CAPS: 100.0000 mg | ORAL_CAPSULE | Freq: Two times a day (BID) | ORAL | 0 refills | Status: DC

## 2022-06-01 NOTE — Progress Notes (Signed)
Expedited partner therapy sent to [facility] [address] as partner has tested positive for trichomonas and chlamydia.

## 2022-06-03 ENCOUNTER — Emergency Department (HOSPITAL_BASED_OUTPATIENT_CLINIC_OR_DEPARTMENT_OTHER)
Admission: EM | Admit: 2022-06-03 | Discharge: 2022-06-03 | Discharge status: Against Medical Advice | Payer: Medicaid Other | Attending: Emergency Medicine | Admitting: Emergency Medicine

## 2022-06-03 ENCOUNTER — Other Ambulatory Visit: Payer: Self-pay

## 2022-06-03 ENCOUNTER — Encounter (HOSPITAL_BASED_OUTPATIENT_CLINIC_OR_DEPARTMENT_OTHER): Payer: Self-pay | Admitting: *Deleted

## 2022-06-03 DIAGNOSIS — Z5321 Procedure and treatment not carried out due to patient leaving prior to being seen by health care provider: Secondary | ICD-10-CM | POA: Diagnosis not present

## 2022-06-03 DIAGNOSIS — M549 Dorsalgia, unspecified: Secondary | ICD-10-CM | POA: Insufficient documentation

## 2022-06-03 DIAGNOSIS — R569 Unspecified convulsions: Secondary | ICD-10-CM | POA: Diagnosis present

## 2022-06-03 NOTE — ED Notes (Signed)
Called for pt x3 with no response. Triage nurse notified.
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
## 2022-06-03 NOTE — ED Notes (Signed)
Pt appears to have left with his wife (who was also a pt), when she was d/c'd. No answer, unable to find.

## 2022-06-07 ENCOUNTER — Ambulatory Visit: Payer: No Typology Code available for payment source | Admitting: Podiatry

## 2022-06-07 ENCOUNTER — Ambulatory Visit: Payer: Medicaid Other | Admitting: Internal Medicine

## 2022-06-12 ENCOUNTER — Encounter: Payer: Medicaid Other | Admitting: Urology

## 2022-07-10 ENCOUNTER — Other Ambulatory Visit: Payer: Self-pay

## 2022-07-10 ENCOUNTER — Encounter (HOSPITAL_BASED_OUTPATIENT_CLINIC_OR_DEPARTMENT_OTHER): Payer: Self-pay | Admitting: Emergency Medicine

## 2022-07-10 ENCOUNTER — Other Ambulatory Visit (HOSPITAL_BASED_OUTPATIENT_CLINIC_OR_DEPARTMENT_OTHER): Payer: Self-pay

## 2022-07-10 ENCOUNTER — Emergency Department (HOSPITAL_BASED_OUTPATIENT_CLINIC_OR_DEPARTMENT_OTHER)
Admission: EM | Admit: 2022-07-10 | Discharge: 2022-07-10 | Disposition: A | Discharge status: Home / Self Care | Payer: Medicaid Other | Attending: Emergency Medicine | Admitting: Emergency Medicine

## 2022-07-10 ENCOUNTER — Emergency Department (HOSPITAL_BASED_OUTPATIENT_CLINIC_OR_DEPARTMENT_OTHER): Payer: Medicaid Other

## 2022-07-10 DIAGNOSIS — R112 Nausea with vomiting, unspecified: Secondary | ICD-10-CM | POA: Diagnosis not present

## 2022-07-10 DIAGNOSIS — R1030 Lower abdominal pain, unspecified: Secondary | ICD-10-CM

## 2022-07-10 LAB — COMPREHENSIVE METABOLIC PANEL
ALT: 20 U/L (ref 0–44)
AST: 19 U/L (ref 15–41)
Albumin: 4.4 g/dL (ref 3.5–5.0)
Alkaline Phosphatase: 65 U/L (ref 38–126)
Anion gap: 7 (ref 5–15)
BUN: 11 mg/dL (ref 6–20)
CO2: 27 mmol/L (ref 22–32)
Calcium: 9.4 mg/dL (ref 8.9–10.3)
Chloride: 106 mmol/L (ref 98–111)
Creatinine, Ser: 0.88 mg/dL (ref 0.61–1.24)
GFR, Estimated: >60 mL/min (ref >60)
Glucose, Bld: 103 mg/dL — ABNORMAL HIGH (ref 70–99)
Potassium: 3.9 mmol/L (ref 3.5–5.1)
Sodium: 140 mmol/L (ref 135–145)
Total Bilirubin: 0.5 mg/dL (ref 0.3–1.2)
Total Protein: 7.5 g/dL (ref 6.5–8.1)

## 2022-07-10 LAB — URINALYSIS, ROUTINE W REFLEX MICROSCOPIC
Bilirubin Urine: NEGATIVE
Glucose, UA: NEGATIVE mg/dL
Hgb urine dipstick: NEGATIVE
Ketones, ur: NEGATIVE mg/dL
Leukocytes,Ua: NEGATIVE
Nitrite: NEGATIVE
Protein, ur: NEGATIVE mg/dL
Specific Gravity, Urine: 1.026 (ref 1.005–1.030)
pH: 6.5 (ref 5.0–8.0)

## 2022-07-10 LAB — CBC
HCT: 45 % (ref 39.0–52.0)
Hemoglobin: 15.5 g/dL (ref 13.0–17.0)
MCH: 27.8 pg (ref 26.0–34.0)
MCHC: 34.4 g/dL (ref 30.0–36.0)
MCV: 80.8 fL (ref 80.0–100.0)
Platelets: 237 10*3/uL (ref 150–400)
RBC: 5.57 MIL/uL (ref 4.22–5.81)
RDW: 13 % (ref 11.5–15.5)
WBC: 5.8 10*3/uL (ref 4.0–10.5)
nRBC: 0 % (ref 0.0–0.2)

## 2022-07-10 LAB — LIPASE, BLOOD: Lipase: 41 U/L (ref 11–51)

## 2022-07-10 MED ORDER — ONDANSETRON HCL 4 MG PO TABS
4.0000 mg | ORAL_TABLET | Freq: Four times a day (QID) | ORAL | 0 refills | Status: AC
  Filled 2022-07-10: qty 12, 3d supply, fill #0

## 2022-07-10 MED ORDER — IOHEXOL 300 MG/ML  SOLN: 100.0000 mL | Freq: Once | INTRAMUSCULAR | Status: DC | PRN

## 2022-07-10 MED ORDER — ONDANSETRON HCL 4 MG/2ML IJ SOLN
4.0000 mg | Freq: Once | INTRAMUSCULAR | Status: AC
  Administered 2022-07-10: 4 mg via INTRAVENOUS
  Filled 2022-07-10: qty 2

## 2022-07-10 NOTE — ED Triage Notes (Signed)
Pt would like a urine test for chlamydia, was dx last month.

## 2022-07-10 NOTE — Discharge Instructions (Addendum)
You are seen today for stomach pain and your labs are reassuring.  You declined the CT scan.  As discussed you need to follow-up with your GI doctor for the endoscopy that they had not completed Back to the ER if you have pain, fevers or other worsening symptoms.

## 2022-07-10 NOTE — ED Triage Notes (Signed)
N/v, abdominal discomfort (like upset stomach), no fevers for 2 days.

## 2022-07-10 NOTE — ED Provider Notes (Signed)
[place] Provider Note   CSN: [id number] Arrival date & time: 07/10/22  1121     History  Chief Complaint  Patient presents with   Abdominal Pain    Craig Carlson is a 22 y.o. male.  He presents to the Emergency Department today complaining of lower abdominal pain.  He denies any urinary symptoms, no fevers or chills, he states that since coming to the ER has been feeling better.  He did have GI illness and was ultimately found to have hepatitis A.  Followed up eventually as an outpatient GI emesis to have endoscopy but procedure was canceled he is waiting on follow-up. He states part of his concern today was that he wanted to make sure his chlamydia have been resolved after he finished doxycycline recently.   Abdominal Pain      Home Medications Prior to Admission medications   Medication Sig Start Date End Date Taking? Authorizing Provider  ondansetron (ZOFRAN) 4 MG tablet Take 1 tablet (4 mg total) by mouth every 6 (six) hours. 07/10/22  Yes Nailah Luepke A, PA-C  doxycycline (VIBRAMYCIN) 100 MG capsule Take 1 capsule (100 mg total) by mouth 2 (two) times daily. Take with food as can cause GI distress. 06/01/22   Megan Salon, MD  metroNIDAZOLE (FLAGYL) 500 MG tablet Take 1 tablet (500 mg total) by mouth 2 (two) times daily. 06/01/22   Megan Salon, MD      Allergies    Patient has no known allergies.    Review of Systems   Review of Systems  Gastrointestinal:  Positive for abdominal pain.    Physical Exam Updated Vital Signs BP 113/71   Pulse 69   Temp 98.5 F (36.9 C)   Resp 16   SpO2 100%  Physical Exam Vitals and nursing note reviewed.  Constitutional:      General: He is not in acute distress.    Appearance: He is well-developed.  HENT:     Head: Normocephalic and atraumatic.  Eyes:     Conjunctiva/sclera: Conjunctivae normal.  Cardiovascular:     Rate and Rhythm: Normal rate and regular  rhythm.     Heart sounds: No murmur heard. Pulmonary:     Effort: Pulmonary effort is normal. No respiratory distress.     Breath sounds: Normal breath sounds.  Abdominal:     Palpations: Abdomen is soft.     Tenderness: There is no abdominal tenderness. There is no right CVA tenderness, left CVA tenderness, guarding or rebound. Negative signs include Murphy's sign, Rovsing's sign and McBurney's sign.  Musculoskeletal:        General: No swelling.     Cervical back: Neck supple.  Skin:    General: Skin is warm and dry.     Capillary Refill: Capillary refill takes less than 2 seconds.  Neurological:     General: No focal deficit present.     Mental Status: He is alert.  Psychiatric:        Mood and Affect: Mood normal.     ED Results / Procedures / Treatments   Labs (all labs ordered are listed, but only abnormal results are displayed) Labs Reviewed  COMPREHENSIVE METABOLIC PANEL - Abnormal; Notable for the following components:      Result Value   Glucose, Bld 103 (*)    All other components within normal limits  LIPASE, BLOOD  CBC  URINALYSIS, ROUTINE W REFLEX MICROSCOPIC  GC/CHLAMYDIA PROBE AMP ([facility]) NOT AT [facility]    EKG None  Radiology No results found.  Procedures Procedures    Medications Ordered in ED Medications  iohexol (OMNIPAQUE) 300 MG/ML solution 100 mL (has no administration in time range)  ondansetron (ZOFRAN) injection 4 mg (4 mg Intravenous Given 07/10/22 1252)    ED Course/ Medical Decision Making/ A&P                             Medical Decision Making This patient presents to the ED for concern of abdominal pain, this involves an extensive number of treatment options, and is a complaint that carries with it a high risk of complications and morbidity.  The differential diagnosis includes appendicitis, UTI, nephrolithiasis, cholecystitis, other   Co morbidities that complicate the patient evaluation  Patient is a, recent chlamydia  treatment   Additional history obtained:  Additional history obtained from EMR External records from outside source obtained and reviewed including prior outpatient GI visit and prior inpatient note for status epilepticus secondary to medication noncompliance   Lab Tests:  I Ordered, and personally interpreted labs.  The pertinent results include: CBC CMP urinalysis are all reassuring     Problem List / ED Course / Critical interventions / Medication management  Abdominal pain-patient states nausea that resolved with Zofran, abdominal pain is resolved he does not want a CT, he is well-appearing, we discussed follow-up with GI.  He states he is waiting for them to call to reschedule his appointment to discuss that he needs to call them since they have not followed up and called him back to reschedule his endoscopy he is already established with GI I ordered medication including Zofran for nause  Reevaluation of the patient after these medicines showed that the patient improved I have reviewed the patients home medicines and have made adjustments as needed     Test / Admission - Considered:  Considered C but patient declined this, he states that he is pain-free and he is not concerned and had CTs for the same type of pain given his very benign exam I feel this is reasonable and we can defer CT at this time.  Given strict return precautions    Amount and/or Complexity of Data Reviewed Labs: ordered.  Risk Prescription drug management.           Final Clinical Impression(s) / ED Diagnoses Final diagnoses:  Lower abdominal pain    Rx / DC Orders ED Discharge Orders          Ordered    ondansetron (ZOFRAN) 4 MG tablet  Every 6 hours        07/10/22 [address], PA-C 07/10/22 1701    Ezequiel Essex, MD 07/10/22 1712

## 2022-07-11 LAB — GC/CHLAMYDIA PROBE AMP (~~LOC~~) NOT AT ARMC
Chlamydia: NEGATIVE
Comment: NEGATIVE
Comment: NORMAL
Neisseria Gonorrhea: NEGATIVE

## 2022-07-17 ENCOUNTER — Other Ambulatory Visit (HOSPITAL_BASED_OUTPATIENT_CLINIC_OR_DEPARTMENT_OTHER): Payer: Self-pay

## 2022-07-23 ENCOUNTER — Encounter (HOSPITAL_BASED_OUTPATIENT_CLINIC_OR_DEPARTMENT_OTHER): Payer: Self-pay | Admitting: Emergency Medicine

## 2022-07-23 ENCOUNTER — Other Ambulatory Visit: Payer: Self-pay

## 2022-07-23 ENCOUNTER — Emergency Department (HOSPITAL_BASED_OUTPATIENT_CLINIC_OR_DEPARTMENT_OTHER)
Admission: EM | Admit: 2022-07-23 | Discharge: 2022-07-23 | Disposition: A | Discharge status: Home / Self Care | Payer: Medicaid Other | Attending: Emergency Medicine | Admitting: Emergency Medicine

## 2022-07-23 DIAGNOSIS — Z1152 Encounter for screening for COVID-19: Secondary | ICD-10-CM | POA: Insufficient documentation

## 2022-07-23 DIAGNOSIS — Z8616 Personal history of COVID-19: Secondary | ICD-10-CM | POA: Insufficient documentation

## 2022-07-23 DIAGNOSIS — M7918 Myalgia, other site: Secondary | ICD-10-CM | POA: Diagnosis present

## 2022-07-23 DIAGNOSIS — B349 Viral infection, unspecified: Secondary | ICD-10-CM | POA: Insufficient documentation

## 2022-07-23 LAB — RESP PANEL BY RT-PCR (RSV, FLU A&B, COVID)  RVPGX2
Influenza A by PCR: NEGATIVE
Influenza B by PCR: NEGATIVE
Resp Syncytial Virus by PCR: NEGATIVE
SARS Coronavirus 2 by RT PCR: NEGATIVE

## 2022-07-23 NOTE — ED Triage Notes (Signed)
Pt reports he wants flu test

## 2022-07-23 NOTE — ED Provider Notes (Signed)
[facility] Provider Note   CSN: [id number] Arrival date & time: 07/23/22  1013     History  Chief Complaint  Patient presents with   Generalized Body Aches    Luman Basset Flow is a 22 y.o. male.  22 year old male with no past medical history presents to the ED requesting influenza along with COVID-19 testing.  Patient reports he began to feel body aches, sore throat, generalized fatigue approximately 2 days ago, he would like to know if he has COVID or flu in order to return back to work.  He has not been taking anything for his symptoms.  No fever, no chest pain, no shortness of breath.  The history is provided by the patient.       Home Medications Prior to Admission medications   Medication Sig Start Date End Date Taking? Authorizing Provider  doxycycline (VIBRAMYCIN) 100 MG capsule Take 1 capsule (100 mg total) by mouth 2 (two) times daily. Take with food as can cause GI distress. 06/01/22   Megan Salon, MD  metroNIDAZOLE (FLAGYL) 500 MG tablet Take 1 tablet (500 mg total) by mouth 2 (two) times daily. 06/01/22   Megan Salon, MD  ondansetron (ZOFRAN) 4 MG tablet Take 1 tablet (4 mg total) by mouth every 6 (six) hours. 07/10/22   Sherrye Payor A, PA-C      Allergies    Patient has no known allergies.    Review of Systems   Review of Systems  Constitutional:  Negative for fever.  HENT:  Positive for sore throat.   Respiratory:  Negative for shortness of breath.   Cardiovascular:  Negative for chest pain.  Musculoskeletal:  Positive for myalgias.    Physical Exam Updated Vital Signs BP 135/77 (BP Location: Right Arm)   Pulse 93   Temp 98.4 F (36.9 C)   Resp 16   SpO2 99%  Physical Exam Vitals and nursing note reviewed.  Constitutional:      Appearance: He is well-developed.  HENT:     Head: Normocephalic and atraumatic.  Eyes:     General: No scleral icterus.    Pupils: Pupils are equal, round, and  reactive to light.  Cardiovascular:     Heart sounds: Normal heart sounds.  Pulmonary:     Effort: Pulmonary effort is normal.     Breath sounds: Normal breath sounds. No wheezing.  Chest:     Chest wall: No tenderness.  Abdominal:     General: Bowel sounds are normal. There is no distension.     Palpations: Abdomen is soft.     Tenderness: There is no abdominal tenderness.  Musculoskeletal:        General: No tenderness or deformity.     Cervical back: Normal range of motion.  Skin:    General: Skin is warm and dry.  Neurological:     Mental Status: He is alert and oriented to person, place, and time.     ED Results / Procedures / Treatments   Labs (all labs ordered are listed, but only abnormal results are displayed) Labs Reviewed  RESP PANEL BY RT-PCR (RSV, FLU A&B, COVID)  [id number]    EKG None  Radiology No results found.  Procedures Procedures    Medications Ordered in ED Medications - No data to display  ED Course/ Medical Decision Making/ A&P  Medical Decision Making    Patient presents to the ED with a chief complaint of bodyaches, fever, cough which began approximately 2 days ago, concern for viral illness.  He is requesting an influenza versus a COVID test on today's visit.  He reports [facility] not [facility] that this testing.  Vitals are within normal limits, no hypoxia, no tachycardia, he is normotensive and afebrile.  Lungs are clear to auscultation.  Oropharynx is mildly erythematous but no tonsillar exudate noted.  He did have an influenza and COVID test while in the emergency department, we discussed checking his results via [place].  He is overall hemodynamically stable, return precautions discussed at length.  Patient stable for discharge.   Portions of this note were generated with [employer]. Dictation errors may occur despite best attempts at proofreading.   Final Clinical Impression(s) / ED  Diagnoses Final diagnoses:  Viral illness    Rx / DC Orders ED Discharge Orders     None         Janeece Fitting, PA-C 07/23/22 1123    Pattricia Boss, MD 07/29/22 [phone]

## 2022-07-23 NOTE — Discharge Instructions (Signed)
You were tested for influenza, COVID-19 on today's visit.  These results will be available to you at the end of the day via MyChart.  Please continue to treat your symptoms with Tylenol or ibuprofen.  Return to the emergency department if experience any worsening symptoms.

## 2022-08-02 ENCOUNTER — Encounter: Payer: Self-pay | Admitting: Urology

## 2022-08-02 ENCOUNTER — Encounter: Payer: Medicaid Other | Admitting: Urology

## 2022-08-02 NOTE — Progress Notes (Deleted)
   Assessment: 1. Dysuria      Plan: I personally reviewed the patient's chart including provider notes, lab results.   Chief Complaint: No chief complaint on file.   History of Present Illness:  Craig Carlson is a 22 y.o. male who is seen for evaluation of dysuria. He had a positive chlamydia test on 05/27/2022.  He also had a potential exposure to trichomonas.  He was treated with doxycycline and Flagyl.    Past Medical History:  Past Medical History:  Diagnosis Date   Anxiety    Depression    Seizures ([place])     Past Surgical History:  No past surgical history on file.  Allergies:  No Known Allergies  Family History:  Family History  Problem Relation Age of Onset   Diabetes Mother    Diabetes Father    Cancer Maternal Aunt    Diabetes Maternal Grandmother    Heart attack Maternal Grandmother    Diabetes Paternal Grandmother     Social History:  Social History   Tobacco Use   Smoking status: Never   Smokeless tobacco: Never  Vaping Use   Vaping Use: Never used  Substance Use Topics   Alcohol use: Not Currently    Alcohol/week: 1.0 - 2.0 standard drink of alcohol    Types: 1 - 2 Standard drinks or equivalent per week   Drug use: Not Currently    Types: Marijuana    Review of symptoms:  Constitutional:  Negative for unexplained weight loss, night sweats, fever, chills ENT:  Negative for nose bleeds, sinus pain, painful swallowing CV:  Negative for chest pain, shortness of breath, exercise intolerance, palpitations, loss of consciousness Resp:  Negative for cough, wheezing, shortness of breath GI:  Negative for nausea, vomiting, diarrhea, bloody stools GU:  Positives noted in HPI; otherwise negative for gross hematuria, dysuria, urinary incontinence Neuro:  Negative for seizures, poor balance, limb weakness, slurred speech Psych:  Negative for lack of energy, depression, anxiety Endocrine:  Negative for polydipsia, polyuria, symptoms of  hypoglycemia (dizziness, hunger, sweating) Hematologic:  Negative for anemia, purpura, petechia, prolonged or excessive bleeding, use of anticoagulants  Allergic:  Negative for difficulty breathing or choking as a result of exposure to anything; no shellfish allergy; no allergic response (rash/itch) to materials, foods  Physical exam: There were no vitals taken for this visit. GENERAL APPEARANCE:  Well appearing, well developed, well nourished, NAD HEENT: Atraumatic, Normocephalic, oropharynx clear. NECK: Supple without lymphadenopathy or thyromegaly. LUNGS: Clear to auscultation bilaterally. HEART: Regular Rate and Rhythm without murmurs, gallops, or rubs. ABDOMEN: Soft, non-tender, No Masses. EXTREMITIES: Moves all extremities well.  Without clubbing, cyanosis, or edema. NEUROLOGIC:  Alert and oriented x 3, normal gait, CN II-XII grossly intact.  MENTAL STATUS:  Appropriate. BACK:  Non-tender to palpation.  No CVAT SKIN:  Warm, dry and intact.    Results: No results found for this or any previous visit (from the past 24 hour(s)).

## 2022-08-24 ENCOUNTER — Encounter: Payer: Medicaid Other | Admitting: Nurse Practitioner

## 2022-08-24 DIAGNOSIS — R399 Unspecified symptoms and signs involving the genitourinary system: Secondary | ICD-10-CM

## 2022-08-24 NOTE — Progress Notes (Signed)
I have spent 5 minutes in review of e-visit questionnaire, review and updating patient chart, medical decision making and response to patient.  ° °Krzysztof Reichelt W Lamel Mccarley, NP ° °  °

## 2022-08-24 NOTE — Progress Notes (Signed)
Male bladder infections are not very common.  We worry about prostate or kidney conditions.  The standard of care is to examine the abdomen and kidneys, and to do a urine and blood test to make sure that something more serious is not going on and to also have STD testing if you are sexually active.    E-Visit for Urinary Problems  Based on what you shared with me, I feel your condition warrants further evaluation and I recommend that you be seen for a face to face office visit.  We recommend that you see a provider today.  If your doctor's office is closed [facility] has the following Urgent Cares:    NOTE: You will not be charged for this e-visit.  If you are having a true medical emergency please call 911.       For an urgent face to face visit, [facility] has six urgent care centers for your convenience:     [facility] [place] Get Driving Directions [id number] [place], [place] [address]    [facility]) Get Driving Directions [id number] [place], [place] [address]  [facility] [place] ([place]) Get Driving Directions [id number] [address] [place],  [place]  [address]  [facility] Get Driving Directions [id number] [place], [place], [place] [address]   [facility] Get Driving Directions  [id number] [address] [place], [place] [address]   [facility] [place] Get Driving Directions [id number] [address], [place],  [address]  Your MyChart E-visit questionnaire answers were reviewed by a board certified advanced clinical practitioner to complete your personal care plan based on your specific symptoms.  Thank you for using e-Visits.

## 2022-08-29 ENCOUNTER — Emergency Department (HOSPITAL_BASED_OUTPATIENT_CLINIC_OR_DEPARTMENT_OTHER)
Admission: EM | Admit: 2022-08-29 | Discharge: 2022-08-29 | Disposition: A | Discharge status: Home / Self Care | Payer: Medicaid Other | Attending: Emergency Medicine | Admitting: Emergency Medicine

## 2022-08-29 ENCOUNTER — Other Ambulatory Visit: Payer: Self-pay

## 2022-08-29 ENCOUNTER — Encounter (HOSPITAL_BASED_OUTPATIENT_CLINIC_OR_DEPARTMENT_OTHER): Payer: Self-pay | Admitting: Emergency Medicine

## 2022-08-29 DIAGNOSIS — R300 Dysuria: Secondary | ICD-10-CM | POA: Diagnosis not present

## 2022-08-29 LAB — URINALYSIS, ROUTINE W REFLEX MICROSCOPIC
Bilirubin Urine: NEGATIVE
Glucose, UA: NEGATIVE mg/dL
Hgb urine dipstick: NEGATIVE
Ketones, ur: NEGATIVE mg/dL
Leukocytes,Ua: NEGATIVE
Nitrite: NEGATIVE
Protein, ur: NEGATIVE mg/dL
Specific Gravity, Urine: 1.022 (ref 1.005–1.030)
pH: 6.5 (ref 5.0–8.0)

## 2022-08-29 MED ORDER — METRONIDAZOLE 500 MG PO TABS
2000.0000 mg | ORAL_TABLET | Freq: Once | ORAL | Status: AC
  Administered 2022-08-29: 2000 mg via ORAL
  Filled 2022-08-29: qty 4

## 2022-08-29 MED ORDER — DOXYCYCLINE HYCLATE 100 MG PO CAPS: 100.0000 mg | ORAL_CAPSULE | Freq: Two times a day (BID) | ORAL | 0 refills | Status: AC

## 2022-08-29 MED ORDER — CEFTRIAXONE SODIUM 500 MG IJ SOLR
500.0000 mg | Freq: Once | INTRAMUSCULAR | Status: AC
  Administered 2022-08-29: 500 mg via INTRAMUSCULAR
  Filled 2022-08-29: qty 500

## 2022-08-29 MED ORDER — LIDOCAINE HCL (PF) 1 % IJ SOLN
INTRAMUSCULAR | Status: AC
  Filled 2022-08-29: qty 5

## 2022-08-29 NOTE — ED Triage Notes (Signed)
Malodorous urine and some burning with urination. Started 1 week ago. Admits to unprotected sex

## 2022-08-30 LAB — GC/CHLAMYDIA PROBE AMP (~~LOC~~) NOT AT ARMC
Chlamydia: NEGATIVE
Comment: NEGATIVE
Comment: NORMAL
Neisseria Gonorrhea: NEGATIVE

## 2022-08-30 NOTE — ED Provider Notes (Signed)
[facility] Provider Note   CSN: [id number] Arrival date & time: 08/29/22  1524     History  Chief Complaint  Patient presents with   Dysuria    Craig Carlson is a 22 y.o. male.  HPI     22 year old male presents with concern for dysuria.  Reports that he had unprotected intercourse.  About a week ago he began to have dysuria, lukewarm water and felt like it improved, however over the last couple days it has started again.  Denies any penile discharge.  He did start using a different type of soap.  Denies any abdominal pain, rash, testicular pain, nausea, vomiting, fevers or other concerns.   Home Medications Prior to Admission medications   Medication Sig Start Date End Date Taking? Authorizing Provider  doxycycline (VIBRAMYCIN) 100 MG capsule Take 1 capsule (100 mg total) by mouth 2 (two) times daily for 7 days. 08/29/22 09/05/22 Yes Gareth Morgan, MD  metroNIDAZOLE (FLAGYL) 500 MG tablet Take 1 tablet (500 mg total) by mouth 2 (two) times daily. 06/01/22   Megan Salon, MD  ondansetron (ZOFRAN) 4 MG tablet Take 1 tablet (4 mg total) by mouth every 6 (six) hours. 07/10/22   Sherrye Payor A, PA-C      Allergies    Patient has no known allergies.    Review of Systems   Review of Systems  Physical Exam Updated Vital Signs BP (!) 146/69 (BP Location: Right Arm)   Pulse 86   Temp 98.2 F (36.8 C)   Resp 18   SpO2 98%  Physical Exam Vitals and nursing note reviewed.  Constitutional:      General: He is not in acute distress.    Appearance: Normal appearance. He is not ill-appearing, toxic-appearing or diaphoretic.  HENT:     Head: Normocephalic.  Eyes:     Conjunctiva/sclera: Conjunctivae normal.  Cardiovascular:     Rate and Rhythm: Normal rate and regular rhythm.     Pulses: Normal pulses.  Pulmonary:     Effort: Pulmonary effort is normal. No respiratory distress.  Abdominal:     General: Abdomen is  flat. There is no distension.     Tenderness: There is no abdominal tenderness.  Musculoskeletal:        General: No deformity or signs of injury.     Cervical back: No rigidity.  Skin:    General: Skin is warm and dry.     Coloration: Skin is not jaundiced or pale.  Neurological:     General: No focal deficit present.     Mental Status: He is alert and oriented to person, place, and time.     ED Results / Procedures / Treatments   Labs (all labs ordered are listed, but only abnormal results are displayed) Labs Reviewed  URINALYSIS, ROUTINE W REFLEX MICROSCOPIC  GC/CHLAMYDIA PROBE AMP ([facility]) NOT AT [facility]    EKG None  Radiology No results found.  Procedures Procedures    Medications Ordered in ED Medications  cefTRIAXone (ROCEPHIN) injection 500 mg (500 mg Intramuscular Given 08/29/22 1712)  metroNIDAZOLE (FLAGYL) tablet 2,000 mg (2,000 mg Oral Given 08/29/22 1711)  lidocaine (PF) (XYLOCAINE) 1 % injection (  Given 08/29/22 1735)    ED Course/ Medical Decision Making/ A&P                             Medical Decision Making Amount and/or  Complexity of Data Reviewed Labs: ordered.  Risk Prescription drug management.   22 year old male presents with concern for dysuria.  Urinalysis completed and personally evaluated interpreted by me shows no evidence of urinary tract infection.  Given history of unprotected intercourse with dysuria, STI is a possibility.  Sent gonorrhea and Chlamydia testing.  Empirically covered with Rocephin IM, Flagyl 2 g, and a prescription for doxycycline to take for 1 week.  Offered testing for HIV and syphilis but he declines.  Discussed availability of the health department.  Recommend using barrier protection for intercourse.  Did have a change in soap and discussed that he could consider changing back to [facility] in case this is a chemical urethritis. No history to suggest other intra-abdominal or testicular abnormalities. Patient  discharged in stable condition with understanding of reasons to return.        Final Clinical Impression(s) / ED Diagnoses Final diagnoses:  Dysuria    Rx / DC Orders ED Discharge Orders          Ordered    doxycycline (VIBRAMYCIN) 100 MG capsule  2 times daily        08/29/22 1721              Gareth Morgan, MD 08/30/22 1249
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
## 2023-07-29 IMAGING — CT CT ABD-PELV W/ CM
2 of 4 series · 16 of 46 positions shown, 18 images · IV contrast (APPLIED)
Comparison: Noncontrast CT Abdomen and Pelvis 06/20/2020.

CLINICAL DATA: 20-year-old male with 2 days of right lower quadrant
abdominal pain.

EXAM:
CT ABDOMEN AND PELVIS WITH CONTRAST
TECHNIQUE: Multidetector CT imaging of the abdomen and pelvis was performed
using the standard protocol following bolus administration of
intravenous contrast.

[Series 2: abd pel w · axial · 0.89mm/px · z∈[+1191,+1661]mm · 13 of 104 slices shown, 15 images]
[im 5/104  soft-tissue]
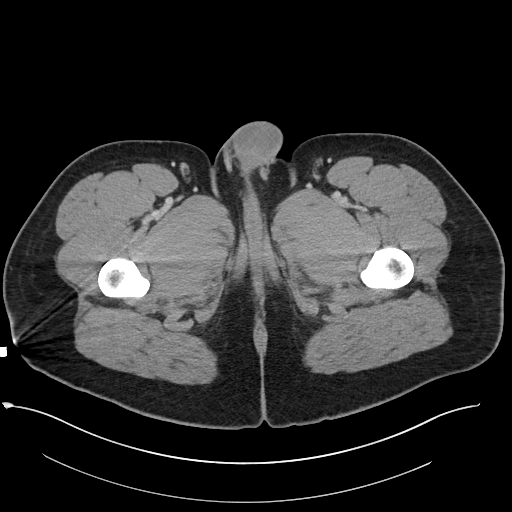
[im 5/104  bone]
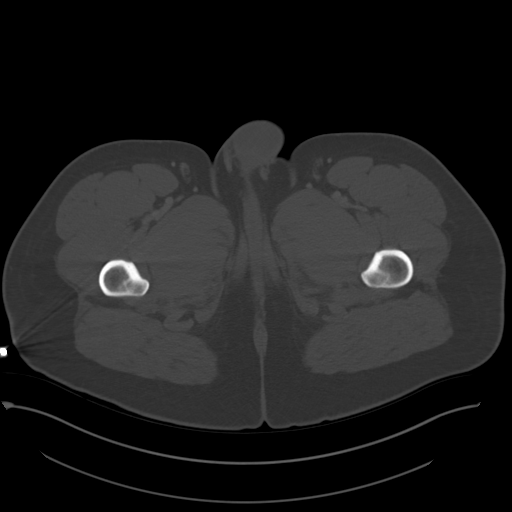
[im 13/104  soft-tissue]
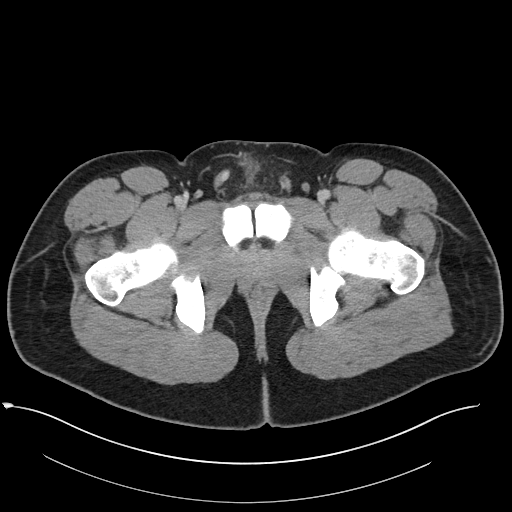
[im 22/104  soft-tissue]
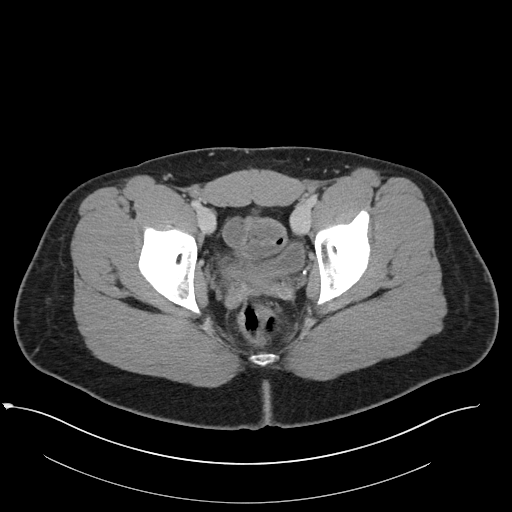
[im 31/104  soft-tissue]
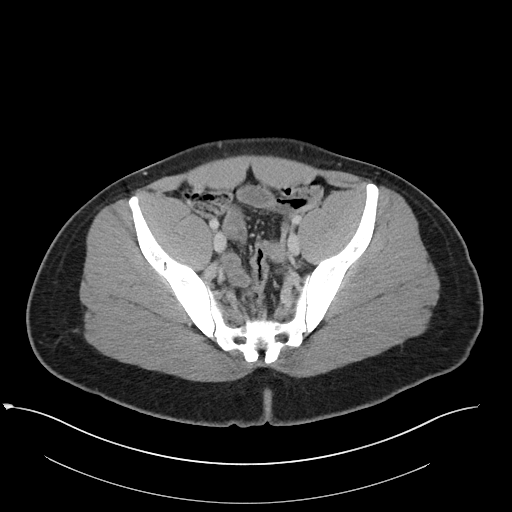
[im 35/104  soft-tissue]
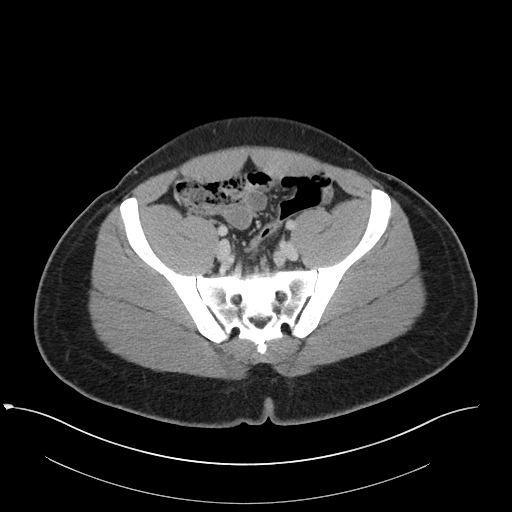
[im 43/104  soft-tissue]
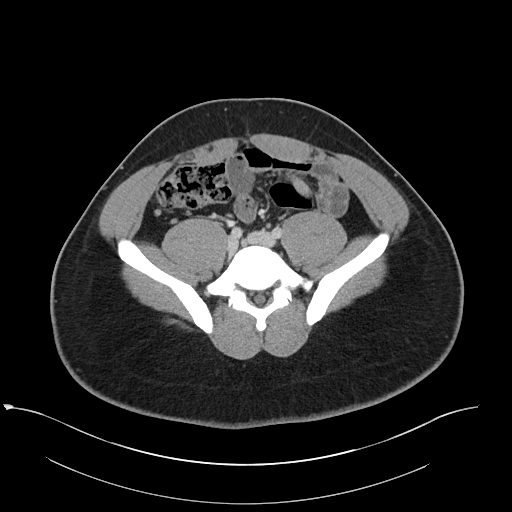
[im 52/104  soft-tissue]
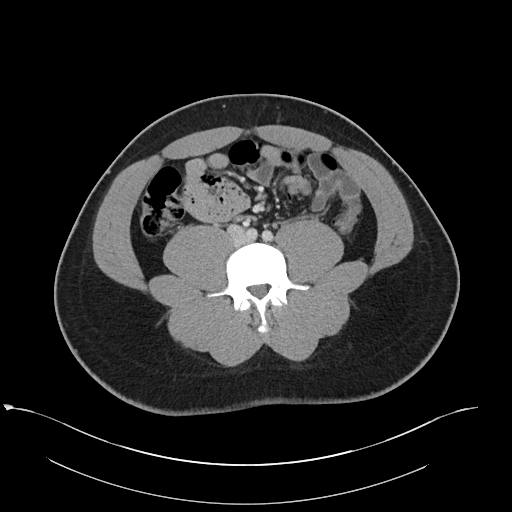
[im 61/104  soft-tissue]
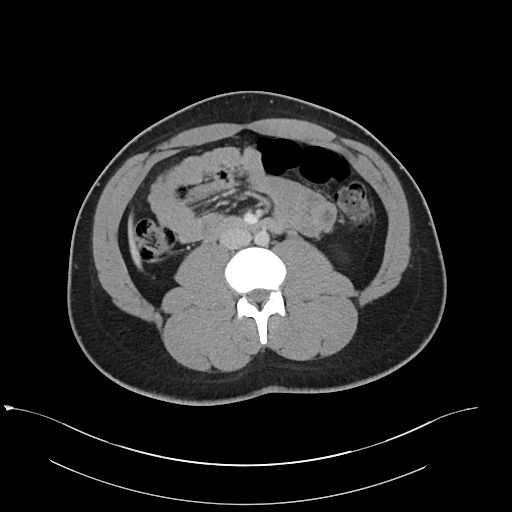
[im 69/104  soft-tissue]
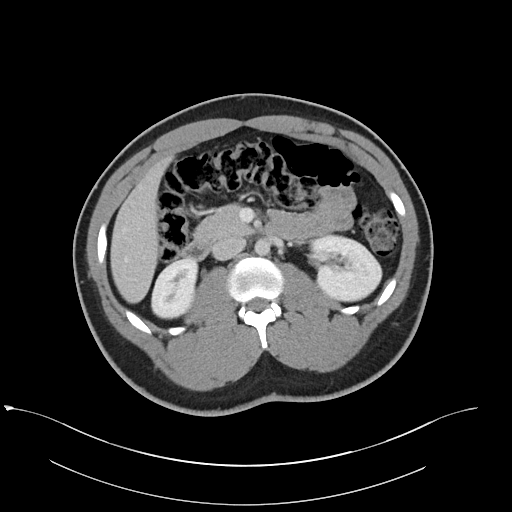
[im 69/104  bone]
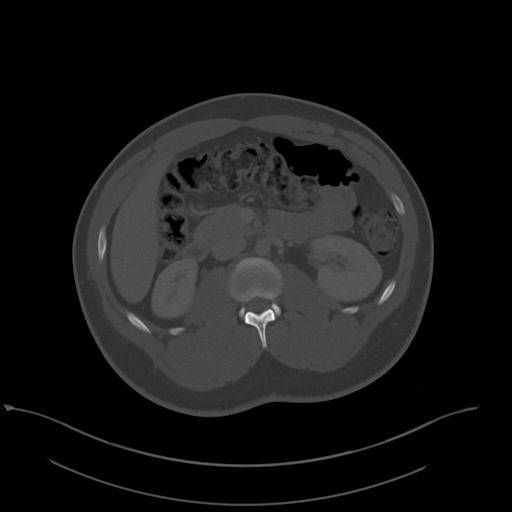
[im 73/104  soft-tissue]
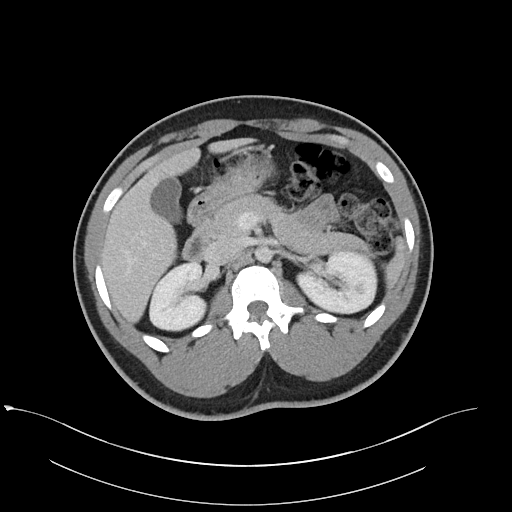
[im 82/104  soft-tissue]
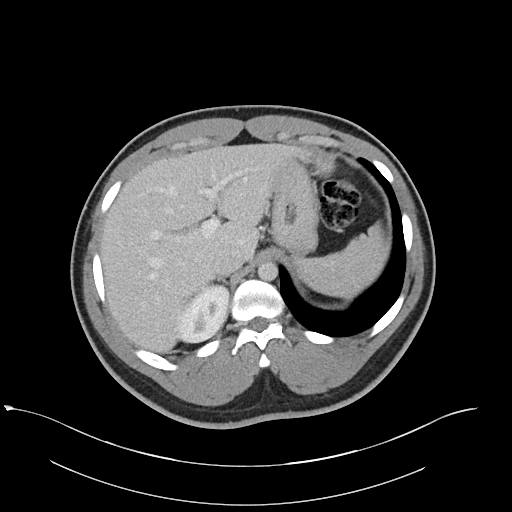
[im 91/104  soft-tissue]
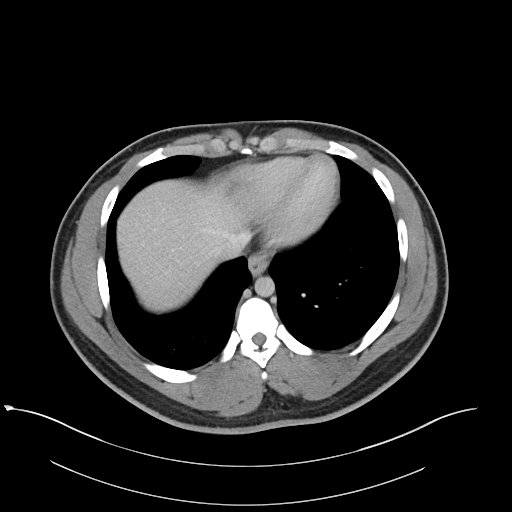
[im 99/104  soft-tissue]
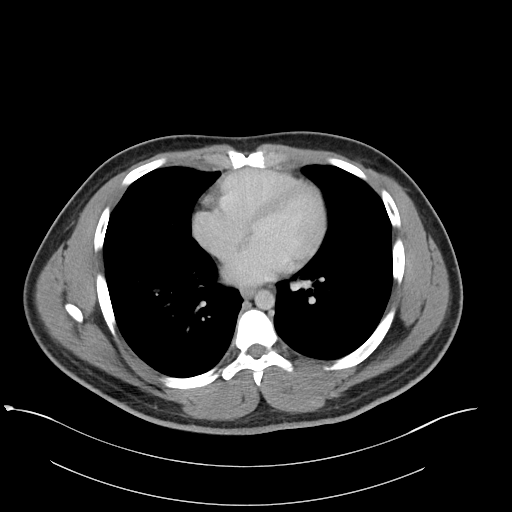

[Series 5: coronal · coronal · 0.87mm/px · 3 of 95 slices shown]
[im 32/95  soft-tissue]
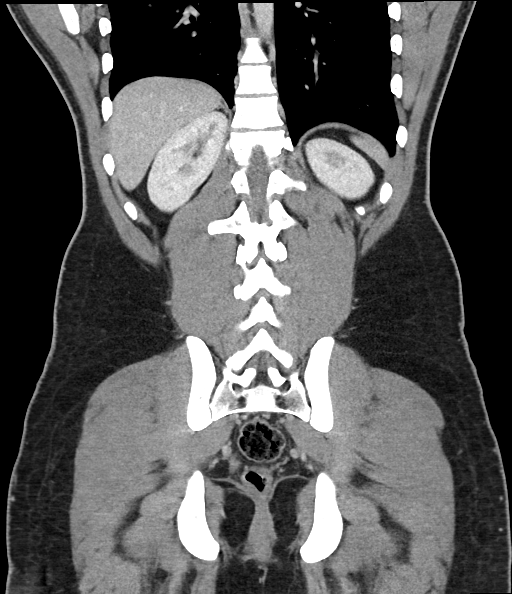
[im 42/95  soft-tissue]
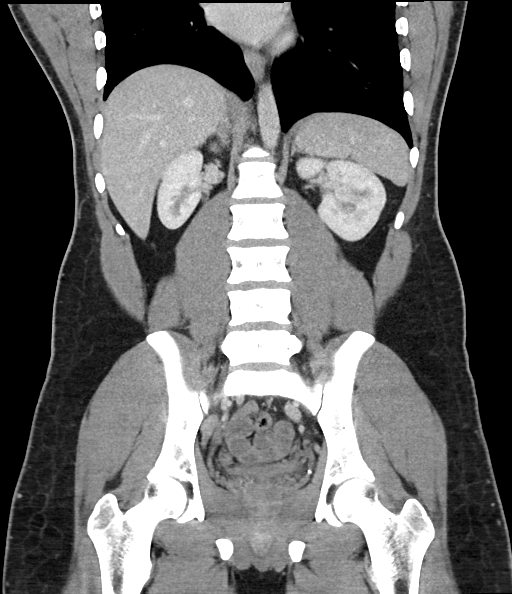
[im 53/95  soft-tissue]
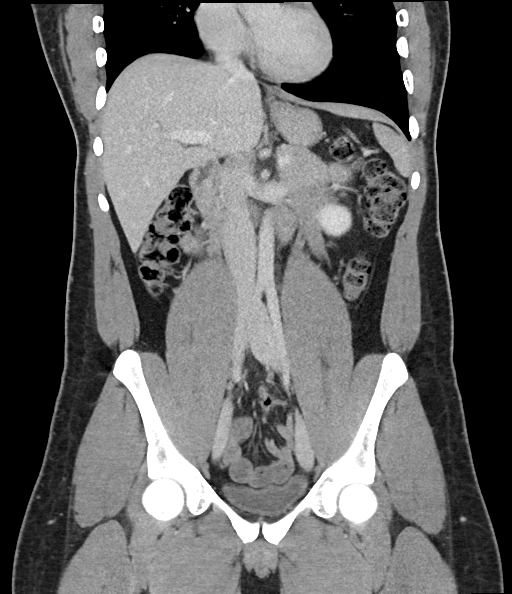

[16 of 46 positions shown; findings below may reference images not displayed]

RADIATION DOSE REDUCTION: This exam was performed according to the
departmental dose-optimization program which includes automated
exposure control, adjustment of the mA and/or kV according to
patient size and/or use of iterative reconstruction technique.

CONTRAST:  100mL OMNIPAQUE IOHEXOL 300 MG/ML  SOLN
FINDINGS: Lower chest: Small fat containing right costophrenic angle
Bochdalek's hernia (normal variant). Otherwise negative.

Hepatobiliary: Negative liver and gallbladder.

Pancreas: Negative.

Spleen: Negative.

Adrenals/Urinary Tract: Normal adrenal glands. Kidneys appears
symmetric and normal. No definite nephrolithiasis. No evidence of
obstruction or renal inflammation. Diminutive bladder.

Stomach/Bowel: Redundant large bowel with retained gas and stool.
Normal retrocecal appendix on series 2, image 65. No large bowel
inflammation. Negative terminal ileum. No dilated small bowel.
Stomach and duodenum appear negative. No free air, free fluid,
mesenteric inflammation.

Vascular/Lymphatic: Major arterial structures in the abdomen and
pelvis appear patent and normal. Pelvic central venous structures
and the portal venous system in the abdomen appear to be patent. No
lymphadenopathy identified.

Reproductive: Negative.

Other: No pelvic free fluid. Multiple pelvic phleboliths.

Musculoskeletal: Negative.
IMPRESSION: Normal appendix. No acute or inflammatory process identified in the
abdomen or pelvis.

## 2023-09-08 ENCOUNTER — Ambulatory Visit: Admitting: Family Medicine

## 2024-02-06 ENCOUNTER — Ambulatory Visit: Admitting: Podiatry

## 2024-02-09 ENCOUNTER — Ambulatory Visit

## 2024-02-09 ENCOUNTER — Ambulatory Visit: Admitting: Family Medicine

## 2024-02-11 ENCOUNTER — Ambulatory Visit: Admitting: Family Medicine

## 2024-02-12 ENCOUNTER — Ambulatory Visit: Admitting: Family Medicine

## 2024-02-12 ENCOUNTER — Encounter: Admitting: Urology

## 2024-02-24 ENCOUNTER — Encounter: Admitting: Urology

## 2024-06-23 ENCOUNTER — Ambulatory Visit: Admitting: Podiatry
